# Patient Record
Sex: Female | Born: 1940 | Race: White | Hispanic: No | Smoking: Former smoker
Health system: Southern US, Community
[De-identification: ages and names within clinical notes are randomized; demographics above are authoritative.]

## PROBLEM LIST (undated history)

## (undated) DIAGNOSIS — R911 Solitary pulmonary nodule: Secondary | ICD-10-CM

## (undated) DIAGNOSIS — R252 Cramp and spasm: Secondary | ICD-10-CM

## (undated) DIAGNOSIS — E785 Hyperlipidemia, unspecified: Secondary | ICD-10-CM

## (undated) DIAGNOSIS — J40 Bronchitis, not specified as acute or chronic: Secondary | ICD-10-CM

## (undated) DIAGNOSIS — J189 Pneumonia, unspecified organism: Secondary | ICD-10-CM

## (undated) DIAGNOSIS — Z86010 Personal history of colonic polyps: Secondary | ICD-10-CM

## (undated) HISTORY — PX: TONSILLECTOMY: SHX5217

## (undated) HISTORY — PX: ABDOMINAL HYSTERECTOMY: SHX81

## (undated) HISTORY — DX: Hyperlipidemia, unspecified: E78.5

## (undated) HISTORY — PX: OTHER SURGICAL HISTORY: SHX169

## (undated) HISTORY — PX: BRONCHOSCOPY: SUR163

## (undated) HISTORY — PX: COLONOSCOPY: SHX174

---

## 2015-03-26 DIAGNOSIS — J189 Pneumonia, unspecified organism: Secondary | ICD-10-CM

## 2015-03-26 DIAGNOSIS — J40 Bronchitis, not specified as acute or chronic: Secondary | ICD-10-CM

## 2015-03-26 HISTORY — DX: Bronchitis, not specified as acute or chronic: J40

## 2015-03-26 HISTORY — DX: Pneumonia, unspecified organism: J18.9

## 2015-04-24 ENCOUNTER — Other Ambulatory Visit (HOSPITAL_COMMUNITY): Payer: Self-pay | Admitting: Pulmonary Disease

## 2015-04-24 DIAGNOSIS — R222 Localized swelling, mass and lump, trunk: Secondary | ICD-10-CM

## 2015-04-30 ENCOUNTER — Encounter: Payer: Self-pay | Admitting: Surgery

## 2015-04-30 ENCOUNTER — Institutional Professional Consult (permissible substitution) (INDEPENDENT_AMBULATORY_CARE_PROVIDER_SITE_OTHER): Payer: Medicare Other | Admitting: Surgery

## 2015-04-30 ENCOUNTER — Other Ambulatory Visit: Payer: Self-pay | Admitting: *Deleted

## 2015-04-30 VITALS — BP 131/64 | HR 74 | Resp 16 | Ht 63.0 in | Wt 125.0 lb

## 2015-04-30 DIAGNOSIS — R911 Solitary pulmonary nodule: Secondary | ICD-10-CM

## 2015-04-30 DIAGNOSIS — R918 Other nonspecific abnormal finding of lung field: Secondary | ICD-10-CM

## 2015-04-30 DIAGNOSIS — J984 Other disorders of lung: Secondary | ICD-10-CM | POA: Diagnosis not present

## 2015-05-01 ENCOUNTER — Ambulatory Visit (HOSPITAL_COMMUNITY)
Admission: RE | Admit: 2015-05-01 | Discharge: 2015-05-01 | Disposition: A | Discharge status: Home / Self Care | Payer: Medicare Other | Source: Ambulatory Visit | Attending: Pulmonary Disease | Admitting: Pulmonary Disease

## 2015-05-01 DIAGNOSIS — R222 Localized swelling, mass and lump, trunk: Secondary | ICD-10-CM | POA: Diagnosis not present

## 2015-05-01 DIAGNOSIS — Z8673 Personal history of transient ischemic attack (TIA), and cerebral infarction without residual deficits: Secondary | ICD-10-CM | POA: Diagnosis not present

## 2015-05-01 LAB — POCT I-STAT CREATININE: CREATININE: 0.7 mg/dL (ref 0.44–1.00)

## 2015-05-01 MED ORDER — GADOBENATE DIMEGLUMINE 529 MG/ML IV SOLN
15.0000 mL | Freq: Once | INTRAVENOUS | Status: AC | PRN
  Administered 2015-05-01: 11 mL via INTRAVENOUS

## 2015-05-05 ENCOUNTER — Encounter: Payer: Self-pay | Admitting: Surgery

## 2015-05-05 NOTE — Progress Notes (Signed)
Cardiothoracic Surgery Consultation  PCP is Cristine Polio, MD Referring Provider is Gwenevere Ghazi, MD  Chief Complaint  Patient presents with  . Lung Mass    right lower lobe.Marland KitchenMarland KitchenCTA CHEST/PET @ [facility] 04/13/15/04/21/15.Marland KitchenMRI BRAIN @ [facility] 05/01/15    HPI:  The patient is a 75 year old active heavy smoker who presented with some left upper chest pain radiating through to the back associated with cough productive of phlegm. She had a CXR on 04/13/2015 showing focal opacification over the right base. A CT scan the same day showed a 1.8 x 2.8 spiculated mass in the RLL just above the diaphragm. There was also an area of nodular consolidation abutting the pleura over the anterior medial lingula with adjacent reticulonodular opacification. There was a patchy area of reticulonodular opacification over the anterior left upper lobe. There was a 1 cm precarinal node and a few other small mediastinal nodes but no hilar adenopathy. A PET scan on 04/21/2015 showed the RLL mass to be hypermetabolic with an SUV of 41.66. The 3.2 x3.5 cm lingular left upper lobe part solid mass had an SUV of 5.95 which is in the malignant range. There was no hypermetabolic mediastinal or hilar adenopathy and no hypermetabolic activity anywhere else. Her left chest pain resolved quickly and has not recurred. She underwent bronchoscopy by Dr. Verdie Mosher which was unremarkable. The biopsy of the RLL showed acute and chronic inflammation with no malignancy.  She had limited spirometry performed in Dr. Marlana Salvage office which showed an FEV1 of 1.0 ( 56% predicted). She is 124 lbs with a BSA of 1.58 m2.  She had a stress echo treadmill test on 04/14/2015 which was a submaximal test with a blunted heart rate response but was negative for ischemia with a normal EF.   Past Medical History  Diagnosis Date  . Hyperlipidemia   . Hypertension     Past Surgical History  Procedure Laterality Date  . Abdominal hysterectomy      and  oophorectomy  . Tonsillectomy      History reviewed. No pertinent family history.  Social History Social History  Substance Use Topics  . Smoking status: Current Every Day Smoker -- 1.50 packs/day for 62 years    Types: Cigarettes  . Smokeless tobacco: Never Used  . Alcohol Use: No    Current Outpatient Prescriptions  Medication Sig Dispense Refill  . aspirin 325 MG tablet Take 325 mg by mouth daily.    Marland Kitchen atorvastatin (LIPITOR) 40 MG tablet Take 40 mg by mouth daily.    . benzonatate (TESSALON) 100 MG capsule Take by mouth 3 (three) times daily as needed for cough.     No current facility-administered medications for this visit.    Allergies  Allergen Reactions  . Penicillins Nausea Only and Rash    Review of Systems  Constitutional: Negative for fever, chills, activity change, appetite change, fatigue and unexpected weight change.  HENT:       Wears dentures  Eyes: Negative.   Respiratory: Positive for cough. Negative for shortness of breath.        Bronchitis  Cardiovascular: Positive for chest pain. Negative for palpitations and leg swelling.       Only one episode as per HPI  Gastrointestinal: Negative.   Endocrine: Negative.   Genitourinary: Negative.   Musculoskeletal: Positive for myalgias and back pain.  Skin: Negative.   Allergic/Immunologic: Negative.   Neurological: Negative.   Hematological: Negative.   Psychiatric/Behavioral: Negative.     BP 131/64  mmHg  Pulse 74  Resp 16  Ht '5\' 3"'$  (1.6 m)  Wt 125 lb (56.7 kg)  BMI 22.15 kg/m2  SpO2 95% Physical Exam  Constitutional: She is oriented to person, place, and time.  Elderly, thin and somewhat frail appearing woman in no distress.  HENT:  Head: Normocephalic and atraumatic.  Mouth/Throat: Oropharynx is clear and moist.  Eyes: EOM are normal. Pupils are equal, round, and reactive to light.  Neck: Normal range of motion. Neck supple. No JVD present. No thyromegaly present.  Cardiovascular: Normal  rate, regular rhythm, normal heart sounds and intact distal pulses.   No murmur heard. Pulmonary/Chest: Effort normal. No respiratory distress. She has no wheezes. She has no rales. She exhibits no tenderness.  Decreased breath sounds throughout. Barrel chest  Abdominal: Soft. Bowel sounds are normal. She exhibits no distension and no mass. There is no tenderness.  Musculoskeletal: Normal range of motion. She exhibits no edema.  Lymphadenopathy:    She has no cervical adenopathy.  Neurological: She is alert and oriented to person, place, and time. She has normal strength. No cranial nerve deficit or sensory deficit.  Skin: Skin is warm and dry.  Psychiatric: She has a normal mood and affect.     Diagnostic Tests:  CT and PET scans reviewed on PACS system.   Impression:  She has a 2.8 cm spiculated mass in the RLL that is very hypermetabolic on PET scan and certainly a lung cancer. There is no hypermetabolic hilar or mediastinal adenopathy but there is a 3.5 cm part solid/part ground glass nodular mass in the lingula that has hypermetabolic activity with an SUV of 5. This could be a synchronous lung cancer or a benign inflammatory or infectious lesion. I think it is unlikely to be a metastatic lesion. She did present with some chest pain and productive cough and could have had a pneumonia initially responsible for this lesion. There was another small and less distinct nodular lesion in the LUL too. Since she is 75 years old and has significant COPD on exam and office spirometry I think it is important to show that this lingular mass is not a cancer before considering a right lower lobectomy for treatment of the RLL mass. I will schedule a CT guided biopsy of the lingular mass and if this is negative or has decreased in size on the CT, obviating the need for a biopsy, I will consider right lower lobectomy if her complete PFT's indicate operability. I reviewed the CT and PET scan with her and her  daughter, my treatment recommendations and alternatives. They are in agreement with proceeding with the LUL biopsy and PFT's. All questions have been answered.  Plan:  1. CT guided LUL lingular mass biopsy unless it has decreased in size on CT.  2. Full PFT's with diffusion capacity.  3.  She is already scheduled for brain MRI.  4. Return to see me afterward to discuss the results and make further treatment plans.  Gaye Pollack, MD [facility] [phone]

## 2015-05-06 ENCOUNTER — Ambulatory Visit (HOSPITAL_COMMUNITY)
Admission: RE | Admit: 2015-05-06 | Discharge: 2015-05-06 | Disposition: A | Discharge status: Home / Self Care | Payer: Medicare Other | Source: Ambulatory Visit | Attending: Surgery | Admitting: Surgery

## 2015-05-06 ENCOUNTER — Other Ambulatory Visit: Payer: Self-pay | Admitting: Radiology

## 2015-05-06 DIAGNOSIS — R918 Other nonspecific abnormal finding of lung field: Secondary | ICD-10-CM | POA: Diagnosis not present

## 2015-05-06 LAB — PULMONARY FUNCTION TEST
DL/VA % pred: 75 %
DL/VA: 3.51 ml/min/mmHg/L
DLCO unc % pred: 52 %
DLCO unc: 11.97 ml/min/mmHg
FEF 25-75 Post: 0.46 L/sec
FEF 25-75 Pre: 0.71 L/sec
FEF2575-%CHANGE-POST: -35 %
FEF2575-%PRED-POST: 29 %
FEF2575-%Pred-Pre: 45 %
FEV1-%CHANGE-POST: -16 %
FEV1-%PRED-POST: 53 %
FEV1-%Pred-Pre: 64 %
FEV1-POST: 1.08 L
FEV1-PRE: 1.3 L
FEV1FVC-%CHANGE-POST: -6 %
FEV1FVC-%Pred-Pre: 88 %
FEV6-%Change-Post: -13 %
FEV6-%PRED-PRE: 75 %
FEV6-%Pred-Post: 65 %
FEV6-PRE: 1.9 L
FEV6-Post: 1.65 L
FEV6FVC-%Change-Post: 0 %
FEV6FVC-%PRED-PRE: 102 %
FEV6FVC-%Pred-Post: 102 %
FVC-%CHANGE-POST: -11 %
FVC-%PRED-POST: 65 %
FVC-%PRED-PRE: 73 %
FVC-POST: 1.74 L
FVC-PRE: 1.96 L
POST FEV6/FVC RATIO: 98 %
Post FEV1/FVC ratio: 62 %
Pre FEV1/FVC ratio: 66 %
Pre FEV6/FVC Ratio: 97 %
RV % PRED: 150 %
RV: 3.38 L
TLC % pred: 107 %
TLC: 5.28 L

## 2015-05-06 MED ORDER — ALBUTEROL SULFATE (2.5 MG/3ML) 0.083% IN NEBU
2.5000 mg | INHALATION_SOLUTION | Freq: Once | RESPIRATORY_TRACT | Status: AC
  Administered 2015-05-06: 2.5 mg via RESPIRATORY_TRACT

## 2015-05-07 ENCOUNTER — Other Ambulatory Visit: Payer: Self-pay | Admitting: Radiology

## 2015-05-09 ENCOUNTER — Ambulatory Visit (HOSPITAL_COMMUNITY)
Admission: RE | Admit: 2015-05-09 | Discharge: 2015-05-09 | Disposition: A | Discharge status: Home / Self Care | Payer: Medicare Other | Source: Ambulatory Visit | Attending: Surgery | Admitting: Surgery

## 2015-05-09 ENCOUNTER — Other Ambulatory Visit: Payer: Self-pay | Admitting: Surgery

## 2015-05-09 ENCOUNTER — Encounter (HOSPITAL_COMMUNITY): Payer: Self-pay

## 2015-05-09 DIAGNOSIS — R911 Solitary pulmonary nodule: Secondary | ICD-10-CM

## 2015-05-09 DIAGNOSIS — I2510 Atherosclerotic heart disease of native coronary artery without angina pectoris: Secondary | ICD-10-CM | POA: Diagnosis not present

## 2015-05-09 DIAGNOSIS — I700 Atherosclerosis of aorta: Secondary | ICD-10-CM | POA: Diagnosis not present

## 2015-05-09 DIAGNOSIS — R918 Other nonspecific abnormal finding of lung field: Secondary | ICD-10-CM | POA: Diagnosis not present

## 2015-05-09 LAB — CBC
HEMATOCRIT: 40.4 % (ref 36.0–46.0)
Hemoglobin: 14 g/dL (ref 12.0–15.0)
MCH: 31.2 pg (ref 26.0–34.0)
MCHC: 34.7 g/dL (ref 30.0–36.0)
MCV: 90 fL (ref 78.0–100.0)
Platelets: 194 10*3/uL (ref 150–400)
RBC: 4.49 MIL/uL (ref 3.87–5.11)
RDW: 13.4 % (ref 11.5–15.5)
WBC: 4.8 10*3/uL (ref 4.0–10.5)

## 2015-05-09 LAB — PROTIME-INR
INR: 1.02 (ref 0.00–1.49)
PROTHROMBIN TIME: 13.6 s (ref 11.6–15.2)

## 2015-05-09 LAB — APTT: APTT: 31 s (ref 24–37)

## 2015-05-09 MED ORDER — FENTANYL CITRATE (PF) 100 MCG/2ML IJ SOLN
INTRAMUSCULAR | Status: AC
  Filled 2015-05-09: qty 4

## 2015-05-09 MED ORDER — LIDOCAINE HCL 1 % IJ SOLN
INTRAMUSCULAR | Status: AC
  Filled 2015-05-09: qty 20

## 2015-05-09 MED ORDER — SODIUM CHLORIDE 0.9 % IV SOLN
INTRAVENOUS | Status: DC
  Administered 2015-05-09: 10:00:00 via INTRAVENOUS

## 2015-05-09 MED ORDER — MIDAZOLAM HCL 2 MG/2ML IJ SOLN
INTRAMUSCULAR | Status: AC
  Filled 2015-05-09: qty 6

## 2015-05-09 NOTE — Sedation Documentation (Signed)
MD spoke with pt today after viewing CT. Pt will not need lung bx today.

## 2015-05-09 NOTE — H&P (Signed)
Chief Complaint: Patient was seen in consultation today for left lung mass biopsy at the request of Gaye Pollack  Referring Physician(s): Gaye Pollack  Supervising Physician: Arne Cleveland  History of Present Illness: Pamela Mueller is a 75 y.o. female   ++ smoker COPD Was seen 03/2015 for chest pian Work up revealed B lung masses and possible pneumonia Treated for PNA and has done well CT and PET scans + for RLL mass Bronchoscopy performed in [facility] by Dr Verdie Mosher Negative/unremarkable per Dr Cyndia Bent note  She has a 2.8 cm spiculated mass in the RLL that is very hypermetabolic on PET scan and certainly a lung cancer. There is no hypermetabolic hilar or mediastinal adenopathy but there is a 3.5 cm part solid/part ground glass nodular mass in the lingula that has hypermetabolic activity with an SUV of 5. This could be a synchronous lung cancer or a benign inflammatory or infectious lesion. I think it is unlikely to be a metastatic lesion. She did present with some chest pain and productive cough and could have had a pneumonia initially responsible for this lesion. There was another small and less distinct nodular lesion in the LUL too. Since she is 75 years old and has significant COPD on exam and office spirometry I think it is important to show that this lingular mass is not a cancer before considering a right lower lobectomy for treatment of the RLL mass. I will schedule a CT guided biopsy of the lingular mass and if this is negative or has decreased in size on the CT, obviating the need for a biopsy, I will consider right lower lobectomy if her complete PFT's indicate operability. I reviewed the CT and PET scan with her and her daughter, my treatment recommendations and alternatives. They are in agreement with proceeding with the LUL biopsy and PFT's. All questions have been answered.  Request now of Dr Cyndia Bent for Left lung mass biopsy to determine fully plan for this pt Imaging has  been reviewed with dr Vernard Gambles and approves procedure  Past Medical History  Diagnosis Date  . Hyperlipidemia   . Hypertension     Past Surgical History  Procedure Laterality Date  . Abdominal hysterectomy      and oophorectomy  . Tonsillectomy      Allergies: Penicillins  Medications: Prior to Admission medications   Medication Sig Start Date End Date Taking? Authorizing Provider  aspirin 325 MG tablet Take 325 mg by mouth daily.   Yes Historical Provider, MD  atorvastatin (LIPITOR) 40 MG tablet Take 40 mg by mouth daily.   Yes Historical Provider, MD  benzonatate (TESSALON) 100 MG capsule Take by mouth 3 (three) times daily as needed for cough.    Historical Provider, MD     History reviewed. No pertinent family history.  Social History   Social History  . Marital Status: Widowed    Spouse Name: N/A  . Number of Children: N/A  . Years of Education: N/A   Social History Main Topics  . Smoking status: Current Every Day Smoker -- 1.50 packs/day for 62 years    Types: Cigarettes  . Smokeless tobacco: Never Used  . Alcohol Use: No  . Drug Use: None  . Sexual Activity: Not Asked   Other Topics Concern  . None   Social History Narrative    Review of Systems: A 12 point ROS discussed and pertinent positives are indicated in the HPI above.  All other systems are negative.  Review of  Systems  Constitutional: Negative for fever, activity change, appetite change and fatigue.  Respiratory: Negative for shortness of breath.   Cardiovascular: Negative for chest pain.  Neurological: Negative for weakness.  Psychiatric/Behavioral: Negative for behavioral problems and confusion.    Vital Signs: BP 137/76 mmHg  Pulse 73  Temp(Src) 97.6 F (36.4 C) (Oral)  Resp 16  Ht 5' 3.5" (1.613 m)  Wt 125 lb (56.7 kg)  BMI 21.79 kg/m2  SpO2 98%  Physical Exam  Constitutional: She is oriented to person, place, and time.  Cardiovascular: Normal rate, regular rhythm and normal  heart sounds.   Pulmonary/Chest: Effort normal and breath sounds normal. She has no wheezes.  Abdominal: Soft. Bowel sounds are normal. There is no tenderness.  Musculoskeletal: Normal range of motion.  Neurological: She is alert and oriented to person, place, and time.  Skin: Skin is warm and dry.  Psychiatric: She has a normal mood and affect. Her behavior is normal. Judgment and thought content normal.  Nursing note and vitals reviewed.   Mallampati Score:  MD Evaluation Airway: WNL Heart: WNL Abdomen: WNL Chest/ Lungs: WNL ASA  Classification: 3 Mallampati/Airway Score: One  Imaging: Mr Kizzie Fantasia Contrast  05/02/2015  CLINICAL DATA:  New diagnosis lung cancer.  Staging. EXAM: MRI HEAD WITHOUT AND WITH CONTRAST TECHNIQUE: Multiplanar, multiecho pulse sequences of the brain and surrounding structures were obtained without and with intravenous contrast. CONTRAST:  11 cc MultiHance COMPARISON:  None. FINDINGS: Diffusion imaging does not show any acute or subacute infarction. 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 There are minimal chronic small-vessel ischemic changes of the hemispheric white matter. No evidence of hemorrhage, hydrocephalus or extra-axial collection. After contrast administration, there is no abnormal enhancement. No evidence of metastatic disease. No skull or skullbase lesion is seen. T1 hyperintense focus at the right parietal vertex is benign. IMPRESSION: No evidence of metastatic disease. 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 Electronically Signed   By: Nelson Chimes M.D.   On: 05/02/2015 08:29    Labs:  CBC:  Recent Labs  05/09/15 0955  WBC 4.8  HGB 14.0  HCT 40.4  PLT 194    COAGS:  Recent Labs  05/09/15 0955  INR 1.02  APTT 31    BMP:  Recent Labs  05/01/15 1912  CREATININE 0.70    LIVER FUNCTION TESTS: No results for input(s): BILITOT, AST, ALT, ALKPHOS, PROT, ALBUMIN in the last 8760 hours.  TUMOR MARKERS: No results for input(s):  AFPTM, CEA, CA199, CHROMGRNA in the last 8760 hours.  Assessment and Plan:  Lung masses in COPD/+smoker pt +PET Bronch unremarkable Has been evaluated with Dr Cyndia Bent For left lung mass biopsy now Risks and Benefits discussed with the patient including, but not limited to bleeding, hemoptysis, respiratory failure requiring intubation, infection, pneumothorax requiring chest tube placement, stroke from air embolism or even death. All of the patient's questions were answered, patient is agreeable to proceed. Consent signed and in chart.   Thank you for this interesting consult.  I greatly enjoyed meeting Pamela Mueller and look forward to participating in their care.  A copy of this report was sent to the requesting provider on this date.  Electronically Signed: Monia Sabal A 05/09/2015, 10:19 AM   I spent a total of  30 Minutes   in face to face in clinical consultation, greater than 50% of which was counseling/coordinating care for left lung mass biopsy

## 2015-05-09 NOTE — Sedation Documentation (Signed)
Pt D/C home with dtr

## 2015-05-14 ENCOUNTER — Ambulatory Visit (INDEPENDENT_AMBULATORY_CARE_PROVIDER_SITE_OTHER): Payer: Medicare Other | Admitting: Surgery

## 2015-05-14 ENCOUNTER — Encounter: Payer: Self-pay | Admitting: Surgery

## 2015-05-14 VITALS — BP 136/77 | HR 76 | Resp 20 | Ht 63.5 in | Wt 125.0 lb

## 2015-05-14 DIAGNOSIS — J984 Other disorders of lung: Secondary | ICD-10-CM

## 2015-05-14 DIAGNOSIS — R918 Other nonspecific abnormal finding of lung field: Secondary | ICD-10-CM

## 2015-05-19 ENCOUNTER — Encounter: Payer: Self-pay | Admitting: Surgery

## 2015-05-19 NOTE — Progress Notes (Signed)
HPI:  The patient returns for review of her repeat CT scan done to evaluate the left upper lobe partially solid mass that was hypermetabolic on PET scan. This showed that this lesion was smaller consistent with a resolving infectious or inflammatory process. Biopsy was therefore deferred. The 2.7 cm spiculated mass in the RLL was unchanged and this is intensely hypermetabolic on PET scan. Her PFT's show a moderate obstructive defect with an FEV1 of 1.3 ( 64% predicted) and a moderately severe diffusion defect ( 52% predicted) There was no response to bronchodilators. She continues to smoke 1-1/2 packs per day.  Current Outpatient Prescriptions  Medication Sig Dispense Refill  . aspirin 325 MG tablet Take 325 mg by mouth daily.    Marland Kitchen atorvastatin (LIPITOR) 40 MG tablet Take 40 mg by mouth daily.    . benzonatate (TESSALON) 100 MG capsule Take by mouth 3 (three) times daily as needed for cough.     No current facility-administered medications for this visit.     Physical Exam: BP 136/77 mmHg  Pulse 76  Resp 20  Ht 5' 3.5" (1.613 m)  Wt 125 lb (56.7 kg)  BMI 21.79 kg/m2  SpO2 96% She looks comfortable and can carry on a conversation without getting short of breath. Lungs reveal decreased BS throughout that are clear.  Diagnostic Tests:  CLINICAL DATA: Hypermetabolic right lower lobe mass. Hypermetabolic lingular lesion. Recent chest pain. Axial scans through the thorax were obtained in contemplation of percutaneous biopsy.  EXAM: CT CHEST WITHOUT CONTRAST  TECHNIQUE: Multidetector CT imaging of the chest was performed following the standard protocol without IV contrast.  COMPARISON: PET-CT 04/21/2015, CT 04/13/2015  FINDINGS: Mediastinum/Lymph Nodes: Moderate coronary and aortic calcifications. Heart size normal. No pericardial effusion. 12 mm precarinal lymph node, stable by my measurement. No other hilar or mediastinal adenopathy is evident.  Lungs/Pleura:  Spiculated 27 mm nodule centrally at the right lung base, stable.  12 x 7 mm pleural-based scarring or subsegmental atelectasis in the lingula anteromedially, previously 30 x 17 mm. There has been resolution of the regional patchy alveolar opacities previously seen around the lesion. 8 mm nodule in the anterior right upper lobe is stable (image 37/2). No new pulmonary nodule or consolidation. No pleural effusion or pneumothorax.  Upper abdomen: Hepatic segment 8 cyst stable. No acute findings.  Musculoskeletal: Spondylitic changes in the visualized lower cervical spine. Endplate spurring in the lower thoracic spine. No worrisome lytic or sclerotic lesion.  IMPRESSION: 1. Significant decrease in size of anterior pleural-based lingular consolidation since prior studies, consistent with resolving infectious/inflammatory process. Percutaneous biopsy was therefore deferred. 2. Persistent 2.7 cm spiculated right lower lobe mass suggestive of primary bronchogenic carcinoma. 3. No new nodule, adenopathy, or other suggestion of disease progression. 4. Atherosclerosis, including aortic and coronary artery disease. Please note that although the presence of coronary artery calcium documents the presence of coronary artery disease, the severity of this disease and any potential stenosis cannot be assessed on this non-gated CT examination. Assessment for potential risk factor modification, dietary therapy or pharmacologic therapy may be warranted, if clinically indicated.   Electronically Signed  By: Lucrezia Europe M.D.  On: 05/09/2015 14:16    Ref Range 13d ago    FVC-Pre L 1.96   FVC-%Pred-Pre % 73   FVC-Post L 1.74   FVC-%Pred-Post % 65   FVC-%Change-Post % -11   FEV1-Pre L 1.30   FEV1-%Pred-Pre % 64   FEV1-Post L 1.08   FEV1-%Pred-Post % 53  FEV1-%Change-Post % -16   FEV6-Pre L 1.90   FEV6-%Pred-Pre % 75   FEV6-Post L 1.65   FEV6-%Pred-Post % 65     FEV6-%Change-Post % -13   Pre FEV1/FVC ratio % 66   FEV1FVC-%Pred-Pre % 88   Post FEV1/FVC ratio % 62   FEV1FVC-%Change-Post % -6   Pre FEV6/FVC Ratio % 97   FEV6FVC-%Pred-Pre % 102   Post FEV6/FVC ratio % 98   FEV6FVC-%Pred-Post % 102   FEV6FVC-%Change-Post % 0   FEF 25-75 Pre L/sec 0.71   FEF2575-%Pred-Pre % 45   FEF 25-75 Post L/sec 0.46   FEF2575-%Pred-Post % 29   FEF2575-%Change-Post % -35   RV L 3.38   RV % pred % 150   TLC L 5.28   TLC % pred % 107   DLCO unc ml/min/mmHg 11.97   DLCO unc % pred % 52   DL/VA ml/min/mmHg/L 3.51   DL/VA % pred % 75   Resulting Agency [facility]       Specimen Collected: 05/06/15 2:58 PM Last Resulted: 05/06/15 4:04 PM                 Scans on Order [id number]        Scan on 05/06/2015 4:56 PM by Deneise Lever, MD         Impression:  She has a 2.7 cm spiculated hypermetabolic lesion in the right lower lobe that is most likely a bronchogenic carcinoma. The left upper lobe lesion is smaller and most likely infectious or inflammatory in this smoker. Her PFT's are probably adequate to allow a right lower lobectomy but at her age I would only do it if she quit smoking for 3 weeks preop to clear up her lungs. She feels like she could do that and would like to pursue surgical treatment. Her daughters are with her and will be with her to observe and encourage. She still works every day at [employer] and walks all day long so I think she would probably do well with surgery if she quits smoking.  Plan:  She will stop smoking and her daughters will call me in 2 weeks to let me know how she is doing. If she does quit smoking then I will plan to do surgery in about 3 weeks.   Gaye Pollack, MD [facility] [phone]

## 2015-05-28 ENCOUNTER — Other Ambulatory Visit: Payer: Self-pay | Admitting: *Deleted

## 2015-05-28 DIAGNOSIS — R911 Solitary pulmonary nodule: Secondary | ICD-10-CM

## 2015-06-25 ENCOUNTER — Encounter: Payer: Self-pay | Admitting: Surgery

## 2015-06-25 ENCOUNTER — Ambulatory Visit (INDEPENDENT_AMBULATORY_CARE_PROVIDER_SITE_OTHER): Payer: Medicare Other | Admitting: Surgery

## 2015-06-25 VITALS — BP 107/65 | HR 67 | Resp 16 | Ht 63.5 in | Wt 126.4 lb

## 2015-06-25 DIAGNOSIS — D381 Neoplasm of uncertain behavior of trachea, bronchus and lung: Secondary | ICD-10-CM | POA: Diagnosis not present

## 2015-06-25 NOTE — Progress Notes (Signed)
     HPI:  The patient returns for follow up before planned right thoracotomy and right lower lobectomy for presumed lung cancer on Tuesday next week. She quit smoking after our last visit on 05/05/2015 and feels much better. She continues to work at [employer] and is very active.  Current Outpatient Prescriptions  Medication Sig Dispense Refill  . aspirin 325 MG tablet Take 325 mg by mouth daily.     No current facility-administered medications for this visit.     Physical Exam: BP 107/65 mmHg  Pulse 67  Resp 16  Ht 5' 3.5" (1.613 m)  Wt 126 lb 6.4 oz (57.335 kg)  BMI 22.04 kg/m2  SpO2 97% She looks well Lungs are clear Cardiac exam shows a regular rate and rhythm with normal heart sounds   Diagnostic Tests:  None today  Impression:  She is ready for right lower lobectomy and has quit smoking for the past two months. I discussed the surgery again with her and her two daughters including alternatives, benefits and risks including but not limited to bleeding, infection, prolonged air leak, respiratory failure, and recurrent cancer. She understands that if there is cancer in any of her lymph nodes that she will need adjuvant chemotherapy. She agrees to proceed.  Plan:  Flexible bronchoscopy, right thoracotomy and right lower lobectomy on Tuesday 07/01/2015.   Gaye Pollack, MD [facility] [phone]

## 2015-06-26 ENCOUNTER — Encounter (HOSPITAL_COMMUNITY): Payer: Self-pay

## 2015-06-26 ENCOUNTER — Other Ambulatory Visit: Payer: Self-pay

## 2015-06-26 ENCOUNTER — Other Ambulatory Visit (HOSPITAL_COMMUNITY): Payer: Medicare Other

## 2015-06-26 ENCOUNTER — Encounter (HOSPITAL_COMMUNITY)
Admission: RE | Admit: 2015-06-26 | Discharge: 2015-06-26 | Disposition: A | Discharge status: Home / Self Care | Payer: Medicare Other | Source: Ambulatory Visit | Attending: Surgery | Admitting: Surgery

## 2015-06-26 VITALS — BP 118/62 | HR 63 | Temp 97.4°F | Resp 18 | Ht 63.0 in | Wt 126.0 lb

## 2015-06-26 DIAGNOSIS — Z01812 Encounter for preprocedural laboratory examination: Secondary | ICD-10-CM | POA: Diagnosis not present

## 2015-06-26 DIAGNOSIS — Z87891 Personal history of nicotine dependence: Secondary | ICD-10-CM | POA: Insufficient documentation

## 2015-06-26 DIAGNOSIS — Z79899 Other long term (current) drug therapy: Secondary | ICD-10-CM | POA: Diagnosis not present

## 2015-06-26 DIAGNOSIS — R911 Solitary pulmonary nodule: Secondary | ICD-10-CM | POA: Diagnosis not present

## 2015-06-26 DIAGNOSIS — Z7982 Long term (current) use of aspirin: Secondary | ICD-10-CM | POA: Diagnosis not present

## 2015-06-26 DIAGNOSIS — Z01818 Encounter for other preprocedural examination: Secondary | ICD-10-CM | POA: Diagnosis present

## 2015-06-26 DIAGNOSIS — E785 Hyperlipidemia, unspecified: Secondary | ICD-10-CM | POA: Insufficient documentation

## 2015-06-26 DIAGNOSIS — Z0183 Encounter for blood typing: Secondary | ICD-10-CM | POA: Insufficient documentation

## 2015-06-26 HISTORY — DX: Bronchitis, not specified as acute or chronic: J40

## 2015-06-26 HISTORY — DX: Personal history of colonic polyps: Z86.010

## 2015-06-26 HISTORY — DX: Solitary pulmonary nodule: R91.1

## 2015-06-26 HISTORY — DX: Cramp and spasm: R25.2

## 2015-06-26 HISTORY — DX: Pneumonia, unspecified organism: J18.9

## 2015-06-26 LAB — URINALYSIS, ROUTINE W REFLEX MICROSCOPIC
BILIRUBIN URINE: NEGATIVE
GLUCOSE, UA: NEGATIVE mg/dL
HGB URINE DIPSTICK: NEGATIVE
Ketones, ur: NEGATIVE mg/dL
Leukocytes, UA: NEGATIVE
Nitrite: NEGATIVE
PROTEIN: NEGATIVE mg/dL
SPECIFIC GRAVITY, URINE: 1.01 (ref 1.005–1.030)
pH: 6 (ref 5.0–8.0)

## 2015-06-26 LAB — COMPREHENSIVE METABOLIC PANEL
ALBUMIN: 3.8 g/dL (ref 3.5–5.0)
ALK PHOS: 50 U/L (ref 38–126)
ALT: 16 U/L (ref 14–54)
ANION GAP: 11 (ref 5–15)
AST: 17 U/L (ref 15–41)
BUN: 10 mg/dL (ref 6–20)
CALCIUM: 9.3 mg/dL (ref 8.9–10.3)
CO2: 22 mmol/L (ref 22–32)
Chloride: 107 mmol/L (ref 101–111)
Creatinine, Ser: 0.55 mg/dL (ref 0.44–1.00)
GFR calc non Af Amer: >60 mL/min (ref >60)
Glucose, Bld: 100 mg/dL — ABNORMAL HIGH (ref 65–99)
POTASSIUM: 4 mmol/L (ref 3.5–5.1)
SODIUM: 140 mmol/L (ref 135–145)
Total Bilirubin: 0.4 mg/dL (ref 0.3–1.2)
Total Protein: 6.2 g/dL — ABNORMAL LOW (ref 6.5–8.1)

## 2015-06-26 LAB — CBC
HCT: 37.1 % (ref 36.0–46.0)
Hemoglobin: 12.6 g/dL (ref 12.0–15.0)
MCH: 30.2 pg (ref 26.0–34.0)
MCHC: 34 g/dL (ref 30.0–36.0)
MCV: 89 fL (ref 78.0–100.0)
PLATELETS: 161 10*3/uL (ref 150–400)
RBC: 4.17 MIL/uL (ref 3.87–5.11)
RDW: 13.1 % (ref 11.5–15.5)
WBC: 5 10*3/uL (ref 4.0–10.5)

## 2015-06-26 LAB — PROTIME-INR
INR: 0.99 (ref 0.00–1.49)
Prothrombin Time: 13.3 seconds (ref 11.6–15.2)

## 2015-06-26 LAB — APTT: APTT: 28 s (ref 24–37)

## 2015-06-26 LAB — BLOOD GAS, ARTERIAL
ACID-BASE EXCESS: 1.3 mmol/L (ref 0.0–2.0)
BICARBONATE: 25.4 meq/L — AB (ref 20.0–24.0)
Drawn by: 421801
FIO2: 0.21
O2 SAT: 97 %
PATIENT TEMPERATURE: 98.6
PO2 ART: 90.5 mmHg (ref 80.0–100.0)
TCO2: 26.6 mmol/L (ref 0–100)
pCO2 arterial: 40.4 mmHg (ref 35.0–45.0)
pH, Arterial: 7.414 (ref 7.350–7.450)

## 2015-06-26 LAB — TYPE AND SCREEN
ABO/RH(D): A POS
ANTIBODY SCREEN: NEGATIVE

## 2015-06-26 LAB — ABO/RH: ABO/RH(D): A POS

## 2015-06-26 LAB — SURGICAL PCR SCREEN
MRSA, PCR: NEGATIVE
Staphylococcus aureus: NEGATIVE

## 2015-06-26 NOTE — Pre-Procedure Instructions (Signed)
Ronae Noell  06/26/2015     No Pharmacies Listed   Your procedure is scheduled on Tues, May 9 @ 7:30 AM  Report to [facility] Admitting at 5:30 AM  Call this number if you have problems the morning of surgery:  [phone]   Remember:  Do not eat food or drink liquids after midnight.              Stop taking your Aspirin. No Goody's,BC's,Aleve,Advil,Motrin,Ibuprofen,Fish Oil,or any Herbal Medications.    Do not wear jewelry, make-up or nail polish.  Do not wear lotions, powders, or perfumes.   Do not shave 48 hours prior to surgery.    Do not bring valuables to the hospital.  [facility] is not responsible for any belongings or valuables.  Contacts, dentures or bridgework may not be worn into surgery.  Leave your suitcase in the car.  After surgery it may be brought to your room.  For patients admitted to the hospital, discharge time will be determined by your treatment team.  Patients discharged the day of surgery will not be allowed to drive home.    Special instructions:  [facility] - Preparing for Surgery  Before surgery, you can play an important role.  Because skin is not sterile, your skin needs to be as free of germs as possible.  You can reduce the number of germs on you skin by washing with CHG (chlorahexidine gluconate) soap before surgery.  CHG is an antiseptic cleaner which kills germs and bonds with the skin to continue killing germs even after washing.  Please DO NOT use if you have an allergy to CHG or antibacterial soaps.  If your skin becomes reddened/irritated stop using the CHG and inform your nurse when you arrive at Short Stay.  Do not shave (including legs and underarms) for at least 48 hours prior to the first CHG shower.  You may shave your face.  Please follow these instructions carefully:   1.  Shower with CHG Soap the night before surgery and the                                morning of Surgery.  2.  If you choose to wash your  hair, wash your hair first as usual with your       normal shampoo.  3.  After you shampoo, rinse your hair and body thoroughly to remove the                      Shampoo.  4.  Use CHG as you would any other liquid soap.  You can apply chg directly       to the skin and wash gently with scrungie or a clean washcloth.  5.  Apply the CHG Soap to your body ONLY FROM THE NECK DOWN.        Do not use on open wounds or open sores.  Avoid contact with your eyes,       ears, mouth and genitals (private parts).  Wash genitals (private parts)       with your normal soap.  6.  Wash thoroughly, paying special attention to the area where your surgery        will be performed.  7.  Thoroughly rinse your body with warm water from the neck down.  8.  DO NOT shower/wash with your normal soap  after using and rinsing off       the CHG Soap.  9.  Pat yourself dry with a clean towel.            10.  Wear clean pajamas.            11.  Place clean sheets on your bed the night of your first shower and do not        sleep with pets.  Day of Surgery  Do not apply any lotions/deoderants the morning of surgery.  Please wear clean clothes to the hospital/surgery center.    Please read over the following fact sheets that you were given. Pain Booklet, Coughing and Deep Breathing, Blood Transfusion Information, MRSA Information and Surgical Site Infection Prevention

## 2015-06-26 NOTE — Progress Notes (Addendum)
Cardiologist denies  Medical Md is Dr. Fredrich Romans  Echo report in epic from 05-08-15  Stress test report in epic from 04-15-15  Heart cath denies  EKG denies in past month

## 2015-06-27 NOTE — Progress Notes (Signed)
Anesthesia Chart Review:  Pt is a 75 year old female scheduled for video bronchoscopy, thoracotomy, R lower lobectomy on 07/01/2015 with Dr. Cyndia Bent.   PMH includes:  Hyperlipidemia, R lung lesion. Former smoker. BMI 22  Medications include: ASA, lipitor  Preoperative labs reviewed.    CT chest 05/09/15:  1. Significant decrease in size of anterior pleural-based lingular consolidation since prior studies, consistent with resolving infectious/inflammatory process. Percutaneous biopsy was therefore deferred. 2. Persistent 2.7 cm spiculated right lower lobe mass suggestive of primary bronchogenic carcinoma. 3. No new nodule, adenopathy, or other suggestion of disease progression. 4. Atherosclerosis, including aortic and coronary artery disease. Please note that although the presence of coronary artery calcium documents the presence of coronary artery disease, the severity of this disease and any potential stenosis cannot be assessed on this non-gated CT examination. Assessment for potential risk factor modification, dietary therapy or pharmacologic therapy may be warranted, if clinically indicated.  EKG 06/26/15: NSR. Septal infarct, age undetermined. Comparison EKG 04/13/15 at [facility] shows septal infarct by notes; tracing requested.   Stress echo 04/15/15:  - Functional capacity is normal for age/sex - 10 METS on the 2 min Bruce protocol - Submaximal exercise treadmill test with blunted heart rate response likely due to meds - Negative stress echocardiogram for ischemia at submaximal stress, failure to achieve target HR reduces sensitivity of test - EF >50%  If no changes, I anticipate pt can proceed with surgery as scheduled.   Willeen Cass, FNP-BC [facility] Stay Surgical Center/Anesthesiology Phone: [phone] 06/27/2015 1:04 PM

## 2015-06-30 NOTE — H&P (Signed)
[address]       [place], [address]             [id number]      Cardiothoracic Surgery History and Physical   PCP is Cristine Polio, MD Referring Provider is Gwenevere Ghazi, MD  Chief Complaint  Patient presents with  . Lung Mass    right lower lobe.Marland KitchenMarland KitchenCTA CHEST/PET @ [facility] 04/13/15/04/21/15.Marland KitchenMRI BRAIN @ [facility] 05/01/15    HPI:  The patient is a 75 year old active heavy smoker who presented with some left upper chest pain radiating through to the back associated with cough productive of phlegm. She had a CXR on 04/13/2015 showing focal opacification over the right base. A CT scan the same day showed a 1.8 x 2.8 spiculated mass in the RLL just above the diaphragm. There was also an area of nodular consolidation abutting the pleura over the anterior medial lingula with adjacent reticulonodular opacification. There was a patchy area of reticulonodular opacification over the anterior left upper lobe. There was a 1 cm precarinal node and a few other small mediastinal nodes but no hilar adenopathy. A PET scan on 04/21/2015 showed the RLL mass to be hypermetabolic with an SUV of 37.09. The 3.2 x3.5 cm lingular left upper lobe part solid mass had an SUV of 5.95 which is in the malignant range. There was no hypermetabolic mediastinal or hilar adenopathy and no hypermetabolic activity anywhere else. Her left chest pain resolved quickly and has not recurred. She underwent bronchoscopy by Dr. Verdie Mosher which was unremarkable. The biopsy of the RLL showed acute and chronic inflammation with no malignancy.  She had limited spirometry performed in Dr. Marlana Salvage office which showed an FEV1 of 1.0 ( 56% predicted). She is 124 lbs with a BSA of 1.58 m2.  She had a stress echo treadmill test on 04/14/2015 which was a submaximal test with a blunted heart rate response but was negative for ischemia with a normal EF.  When I saw her initially on 05/05/2015 I ordered a repeat CT scan  to evaluate the left upper lobe partially solid mass that was hypermetabolic on PET scan. The lesion was smaller consistent with a resolving infectious or inflammatory process. Biopsy was therefore deferred. The 2.7 cm spiculated mass in the RLL was unchanged and this is intensely hypermetabolic on PET scan. Her PFT's show a moderate obstructive defect with an FEV1 of 1.3 ( 64% predicted) and a moderately severe diffusion defect ( 52% predicted) There was no response to bronchodilators. She continued to smoke 1-1/2 packs per day. I felt that her PFT's were probably adequate to tolerate a lower lobectomy if she quit smoking. She quit smoking after our visit on 05/05/2015 and has felt much better. She has continued to work at [employer] and has remained active.   Past Medical History  Diagnosis Date  . Hyperlipidemia   . Hypertension     Past Surgical History  Procedure Laterality Date  . Abdominal hysterectomy      and oophorectomy  . Tonsillectomy      History reviewed. No pertinent family history.  Social History Social History  Substance Use Topics  . Smoking status: Current Every Day Smoker -- 1.50 packs/day for 62 years    Types: Cigarettes  . Smokeless tobacco: Never Used  . Alcohol Use: No    Current Outpatient Prescriptions  Medication Sig Dispense Refill  . aspirin 325 MG tablet Take 325 mg by mouth daily.    Marland Kitchen  atorvastatin (LIPITOR) 40 MG tablet Take 40 mg by mouth daily.    . benzonatate (TESSALON) 100 MG capsule Take by mouth 3 (three) times daily as needed for cough.     No current facility-administered medications for this visit.    Allergies  Allergen Reactions  . Penicillins Nausea Only and Rash    Review of Systems  Constitutional: Negative for fever, chills, activity change, appetite change, fatigue and unexpected weight change.  HENT:   Wears dentures  Eyes: Negative.    Respiratory: Positive for cough. Negative for shortness of breath.   Bronchitis  Cardiovascular: Positive for chest pain. Negative for palpitations and leg swelling.   Only one episode as per HPI  Gastrointestinal: Negative.  Endocrine: Negative.  Genitourinary: Negative.  Musculoskeletal: Positive for myalgias and back pain.  Skin: Negative.  Allergic/Immunologic: Negative.  Neurological: Negative.  Hematological: Negative.  Psychiatric/Behavioral: Negative.    BP 131/64 mmHg  Pulse 74  Resp 16  Ht '5\' 3"'$  (1.6 m)  Wt 125 lb (56.7 kg)  BMI 22.15 kg/m2  SpO2 95% Physical Exam  Constitutional: She is oriented to person, place, and time.  Elderly, thin and somewhat frail appearing woman in no distress.  HENT:  Head: Normocephalic and atraumatic.  Mouth/Throat: Oropharynx is clear and moist.  Eyes: EOM are normal. Pupils are equal, round, and reactive to light.  Neck: Normal range of motion. Neck supple. No JVD present. No thyromegaly present.  Cardiovascular: Normal rate, regular rhythm, normal heart sounds and intact distal pulses.  No murmur heard. Pulmonary/Chest: Effort normal. No respiratory distress. She has no wheezes. She has no rales. She exhibits no tenderness.  Decreased breath sounds throughout. Barrel chest  Abdominal: Soft. Bowel sounds are normal. She exhibits no distension and no mass. There is no tenderness.  Musculoskeletal: Normal range of motion. She exhibits no edema.  Lymphadenopathy:   She has no cervical adenopathy.  Neurological: She is alert and oriented to person, place, and time. She has normal strength. No cranial nerve deficit or sensory deficit.  Skin: Skin is warm and dry.  Psychiatric: She has a normal mood and affect.     Diagnostic Tests:  CT and PET scans reviewed on PACS system.   Impression:  She has a 2.8 cm spiculated mass in the RLL that is very hypermetabolic on PET scan and certainly a lung cancer.  There is no hypermetabolic hilar or mediastinal adenopathy. The left lung nodule is smaller and is most likely a resolving infectious or inflammatory process. I think her PFT's are adequate to tolerate a right lower lobectomy and she has quit smoking for almost two months. I discussed the surgery with her and her daughters including the alternative of biopsy and XRT. I discussed benefits and risks including but not limited to bleeding, blood transfusion, infection, respiratory complications, prolonged air leak and pleural space complications as well as the risk of recurrent cancer. She understands and agrees to proceed.  Plan:  Flexible bronchoscopy and right thoracotomy for right lower lobectomy on Tuesday 07/01/2015.   Gaye Pollack, MD [facility] [phone]

## 2015-06-30 NOTE — Anesthesia Preprocedure Evaluation (Addendum)
Anesthesia Evaluation  Patient identified by MRN, date of birth, ID band Patient awake    Reviewed: Allergy & Precautions, NPO status , Patient's Chart, lab work & pertinent test results  Airway Mallampati: I  TM Distance: >3 FB Neck ROM: Full    Dental  (+) Edentulous Upper, Edentulous Lower   Pulmonary former smoker,    breath sounds clear to auscultation       Cardiovascular negative cardio ROS   Rhythm:Regular Rate:Normal     Neuro/Psych negative neurological ROS  negative psych ROS   GI/Hepatic negative GI ROS, Neg liver ROS,   Endo/Other  negative endocrine ROS  Renal/GU negative Renal ROS  negative genitourinary   Musculoskeletal negative musculoskeletal ROS (+)   Abdominal Normal abdominal exam  (+)   Peds negative pediatric ROS (+)  Hematology negative hematology ROS (+)   Anesthesia Other Findings   Reproductive/Obstetrics negative OB ROS                            Lab Results  Component Value Date   WBC 5.0 06/26/2015   HGB 12.6 06/26/2015   HCT 37.1 06/26/2015   MCV 89.0 06/26/2015   PLT 161 06/26/2015   Lab Results  Component Value Date   CREATININE 0.55 06/26/2015   BUN 10 06/26/2015   NA 140 06/26/2015   K 4.0 06/26/2015   CL 107 06/26/2015   CO2 22 06/26/2015   Lab Results  Component Value Date   INR 0.99 06/26/2015   INR 1.02 05/09/2015   06/2015 EKG: NSR  Anesthesia Physical Anesthesia Plan  ASA: II  Anesthesia Plan: General   Post-op Pain Management:    Induction: Intravenous  Airway Management Planned: Double Lumen EBT  Additional Equipment: Arterial line, CVP and Ultrasound Guidance Line Placement  Intra-op Plan:   Post-operative Plan: Possible Post-op intubation/ventilation  Informed Consent: I have reviewed the patients History and Physical, chart, labs and discussed the procedure including the risks, benefits and alternatives for the  proposed anesthesia with the patient or authorized representative who has indicated his/her understanding and acceptance.   Dental advisory given  Plan Discussed with: CRNA  Anesthesia Plan Comments:         Anesthesia Quick Evaluation

## 2015-07-01 ENCOUNTER — Inpatient Hospital Stay (HOSPITAL_COMMUNITY): Payer: Medicare Other

## 2015-07-01 ENCOUNTER — Encounter (HOSPITAL_COMMUNITY): Payer: Self-pay | Admitting: *Deleted

## 2015-07-01 ENCOUNTER — Inpatient Hospital Stay (HOSPITAL_COMMUNITY)
Admission: RE | Admit: 2015-07-01 | Discharge: 2015-07-08 | DRG: 164 | Disposition: A | Discharge status: Home / Self Care | Payer: Medicare Other | Source: Ambulatory Visit | Attending: Surgery | Admitting: Surgery

## 2015-07-01 ENCOUNTER — Inpatient Hospital Stay (HOSPITAL_COMMUNITY): Payer: Medicare Other | Admitting: Emergency Medicine

## 2015-07-01 ENCOUNTER — Encounter (HOSPITAL_COMMUNITY)
Admission: RE | Disposition: A | Discharge status: Home / Self Care | Payer: Self-pay | Source: Ambulatory Visit | Attending: Surgery

## 2015-07-01 ENCOUNTER — Inpatient Hospital Stay (HOSPITAL_COMMUNITY): Payer: Medicare Other | Admitting: Certified Registered Nurse Anesthetist

## 2015-07-01 DIAGNOSIS — E785 Hyperlipidemia, unspecified: Secondary | ICD-10-CM | POA: Diagnosis not present

## 2015-07-01 DIAGNOSIS — G4700 Insomnia, unspecified: Secondary | ICD-10-CM | POA: Diagnosis not present

## 2015-07-01 DIAGNOSIS — Z880 Allergy status to penicillin: Secondary | ICD-10-CM | POA: Diagnosis not present

## 2015-07-01 DIAGNOSIS — C3431 Malignant neoplasm of lower lobe, right bronchus or lung: Principal | ICD-10-CM | POA: Diagnosis present

## 2015-07-01 DIAGNOSIS — Z9689 Presence of other specified functional implants: Secondary | ICD-10-CM

## 2015-07-01 DIAGNOSIS — R079 Chest pain, unspecified: Secondary | ICD-10-CM | POA: Diagnosis present

## 2015-07-01 DIAGNOSIS — R222 Localized swelling, mass and lump, trunk: Secondary | ICD-10-CM | POA: Diagnosis not present

## 2015-07-01 DIAGNOSIS — D62 Acute posthemorrhagic anemia: Secondary | ICD-10-CM | POA: Diagnosis not present

## 2015-07-01 DIAGNOSIS — Z23 Encounter for immunization: Secondary | ICD-10-CM

## 2015-07-01 DIAGNOSIS — F17210 Nicotine dependence, cigarettes, uncomplicated: Secondary | ICD-10-CM | POA: Diagnosis present

## 2015-07-01 DIAGNOSIS — J9382 Other air leak: Secondary | ICD-10-CM | POA: Diagnosis not present

## 2015-07-01 DIAGNOSIS — R911 Solitary pulmonary nodule: Secondary | ICD-10-CM

## 2015-07-01 DIAGNOSIS — Z7982 Long term (current) use of aspirin: Secondary | ICD-10-CM | POA: Diagnosis not present

## 2015-07-01 DIAGNOSIS — I10 Essential (primary) hypertension: Secondary | ICD-10-CM | POA: Diagnosis not present

## 2015-07-01 DIAGNOSIS — Z79899 Other long term (current) drug therapy: Secondary | ICD-10-CM | POA: Diagnosis not present

## 2015-07-01 DIAGNOSIS — Z9889 Other specified postprocedural states: Secondary | ICD-10-CM

## 2015-07-01 DIAGNOSIS — J939 Pneumothorax, unspecified: Secondary | ICD-10-CM

## 2015-07-01 HISTORY — PX: VIDEO BRONCHOSCOPY: SHX5072

## 2015-07-01 HISTORY — PX: THORACOTOMY/LOBECTOMY: SHX6116

## 2015-07-01 LAB — GLUCOSE, CAPILLARY
GLUCOSE-CAPILLARY: 140 mg/dL — AB (ref 65–99)
GLUCOSE-CAPILLARY: 122 mg/dL — AB (ref 65–99)
Glucose-Capillary: 120 mg/dL — ABNORMAL HIGH (ref 65–99)
Glucose-Capillary: 139 mg/dL — ABNORMAL HIGH (ref 65–99)

## 2015-07-01 SURGERY — BRONCHOSCOPY, VIDEO-ASSISTED
Anesthesia: General | Laterality: Right

## 2015-07-01 MED ORDER — OXYCODONE HCL 5 MG PO TABS
5.0000 mg | ORAL_TABLET | ORAL | Status: DC | PRN
  Administered 2015-07-04: 5 mg via ORAL
  Administered 2015-07-05 – 2015-07-06 (×4): 10 mg via ORAL
  Administered 2015-07-06: 5 mg via ORAL
  Administered 2015-07-06: 10 mg via ORAL
  Administered 2015-07-07 (×2): 5 mg via ORAL
  Filled 2015-07-01 (×2): qty 2
  Filled 2015-07-01 (×2): qty 1
  Filled 2015-07-01: qty 2
  Filled 2015-07-01: qty 1
  Filled 2015-07-01 (×2): qty 2
  Filled 2015-07-01: qty 1

## 2015-07-01 MED ORDER — EPHEDRINE 5 MG/ML INJ
INTRAVENOUS | Status: AC
  Filled 2015-07-01: qty 10

## 2015-07-01 MED ORDER — FENTANYL CITRATE (PF) 250 MCG/5ML IJ SOLN
INTRAMUSCULAR | Status: AC
  Filled 2015-07-01: qty 5

## 2015-07-01 MED ORDER — ACETAMINOPHEN 500 MG PO TABS
1000.0000 mg | ORAL_TABLET | Freq: Four times a day (QID) | ORAL | Status: AC
  Administered 2015-07-01 – 2015-07-06 (×18): 1000 mg via ORAL
  Filled 2015-07-01 (×18): qty 2

## 2015-07-01 MED ORDER — DIPHENHYDRAMINE HCL 50 MG/ML IJ SOLN: 12.5000 mg | Freq: Four times a day (QID) | INTRAMUSCULAR | Status: DC | PRN

## 2015-07-01 MED ORDER — FENTANYL 40 MCG/ML IV SOLN
INTRAVENOUS | Status: DC
  Administered 2015-07-01: 45 ug via INTRAVENOUS
  Administered 2015-07-01: 210 ug via INTRAVENOUS
  Administered 2015-07-01 – 2015-07-02 (×2): via INTRAVENOUS
  Administered 2015-07-02: 180 ug via INTRAVENOUS
  Administered 2015-07-02: 255 ug via INTRAVENOUS
  Administered 2015-07-02: 90 ug via INTRAVENOUS
  Administered 2015-07-02: 165 ug via INTRAVENOUS
  Administered 2015-07-02: 90 ug via INTRAVENOUS
  Administered 2015-07-02: 165 ug via INTRAVENOUS
  Administered 2015-07-03: 75 ug via INTRAVENOUS
  Administered 2015-07-03: 30 ug via INTRAVENOUS
  Administered 2015-07-03: 60 ug via INTRAVENOUS
  Administered 2015-07-03: 75 ug via INTRAVENOUS
  Administered 2015-07-03: 60 ug via INTRAVENOUS
  Administered 2015-07-03: 30 ug via INTRAVENOUS
  Administered 2015-07-04 (×2): 45 ug via INTRAVENOUS
  Administered 2015-07-04: 30 ug via INTRAVENOUS
  Filled 2015-07-01: qty 25

## 2015-07-01 MED ORDER — MEPERIDINE HCL 25 MG/ML IJ SOLN: 6.2500 mg | INTRAMUSCULAR | Status: DC | PRN

## 2015-07-01 MED ORDER — FENTANYL CITRATE (PF) 250 MCG/5ML IJ SOLN: INTRAMUSCULAR | Status: DC | PRN

## 2015-07-01 MED ORDER — LEVALBUTEROL HCL 0.63 MG/3ML IN NEBU: 0.6300 mg | INHALATION_SOLUTION | Freq: Four times a day (QID) | RESPIRATORY_TRACT | Status: DC | PRN

## 2015-07-01 MED ORDER — MIDAZOLAM HCL 5 MG/5ML IJ SOLN: INTRAMUSCULAR | Status: DC | PRN

## 2015-07-01 MED ORDER — 0.9 % SODIUM CHLORIDE (POUR BTL) OPTIME
TOPICAL | Status: DC | PRN
  Administered 2015-07-01: 2000 mL
  Administered 2015-07-01: 1000 mL

## 2015-07-01 MED ORDER — SODIUM CHLORIDE 0.9% FLUSH: 9.0000 mL | INTRAVENOUS | Status: DC | PRN

## 2015-07-01 MED ORDER — BUPIVACAINE HCL (PF) 0.5 % IJ SOLN
INTRAMUSCULAR | Status: AC
  Filled 2015-07-01: qty 30

## 2015-07-01 MED ORDER — FENTANYL 40 MCG/ML IV SOLN
INTRAVENOUS | Status: AC
  Filled 2015-07-01: qty 25

## 2015-07-01 MED ORDER — LEVOFLOXACIN IN D5W 500 MG/100ML IV SOLN
500.0000 mg | INTRAVENOUS | Status: AC
  Administered 2015-07-01: 500 mg via INTRAVENOUS
  Filled 2015-07-01: qty 100

## 2015-07-01 MED ORDER — METOCLOPRAMIDE HCL 5 MG/ML IJ SOLN
10.0000 mg | Freq: Four times a day (QID) | INTRAMUSCULAR | Status: AC
  Administered 2015-07-01 – 2015-07-03 (×8): 10 mg via INTRAVENOUS
  Filled 2015-07-01 (×8): qty 2

## 2015-07-01 MED ORDER — DIPHENHYDRAMINE HCL 12.5 MG/5ML PO ELIX: 12.5000 mg | ORAL_SOLUTION | Freq: Four times a day (QID) | ORAL | Status: DC | PRN

## 2015-07-01 MED ORDER — PROPOFOL 10 MG/ML IV BOLUS
INTRAVENOUS | Status: AC
  Filled 2015-07-01: qty 20

## 2015-07-01 MED ORDER — LEVOFLOXACIN IN D5W 750 MG/150ML IV SOLN
750.0000 mg | Freq: Once | INTRAVENOUS | Status: AC
  Administered 2015-07-02: 750 mg via INTRAVENOUS
  Filled 2015-07-01: qty 150

## 2015-07-01 MED ORDER — LEVALBUTEROL HCL 0.63 MG/3ML IN NEBU
0.6300 mg | INHALATION_SOLUTION | Freq: Two times a day (BID) | RESPIRATORY_TRACT | Status: DC
  Administered 2015-07-02 – 2015-07-03 (×2): 0.63 mg via RESPIRATORY_TRACT
  Filled 2015-07-01 (×2): qty 3

## 2015-07-01 MED ORDER — LIDOCAINE 2% (20 MG/ML) 5 ML SYRINGE
INTRAMUSCULAR | Status: AC
  Filled 2015-07-01: qty 5

## 2015-07-01 MED ORDER — ROCURONIUM BROMIDE 100 MG/10ML IV SOLN
INTRAVENOUS | Status: DC | PRN
  Administered 2015-07-01 (×2): 40 mg via INTRAVENOUS

## 2015-07-01 MED ORDER — ACETAMINOPHEN 160 MG/5ML PO SOLN
1000.0000 mg | Freq: Four times a day (QID) | ORAL | Status: AC
  Administered 2015-07-06: 1000 mg via ORAL

## 2015-07-01 MED ORDER — CETYLPYRIDINIUM CHLORIDE 0.05 % MT LIQD
7.0000 mL | Freq: Two times a day (BID) | OROMUCOSAL | Status: DC
Start: 2015-07-01 — End: 2015-07-05
  Administered 2015-07-01 – 2015-07-05 (×7): 7 mL via OROMUCOSAL

## 2015-07-01 MED ORDER — EPHEDRINE SULFATE 50 MG/ML IJ SOLN
INTRAMUSCULAR | Status: DC | PRN
  Administered 2015-07-01: 15 mg via INTRAVENOUS

## 2015-07-01 MED ORDER — SUCCINYLCHOLINE CHLORIDE 200 MG/10ML IV SOSY
PREFILLED_SYRINGE | INTRAVENOUS | Status: AC
  Filled 2015-07-01: qty 10

## 2015-07-01 MED ORDER — ONDANSETRON HCL 4 MG/2ML IJ SOLN
4.0000 mg | Freq: Four times a day (QID) | INTRAMUSCULAR | Status: DC | PRN
  Administered 2015-07-02: 4 mg via INTRAVENOUS

## 2015-07-01 MED ORDER — NALOXONE HCL 0.4 MG/ML IJ SOLN: 0.4000 mg | INTRAMUSCULAR | Status: DC | PRN

## 2015-07-01 MED ORDER — ONDANSETRON HCL 4 MG/2ML IJ SOLN
4.0000 mg | Freq: Four times a day (QID) | INTRAMUSCULAR | Status: DC | PRN
  Filled 2015-07-01: qty 2

## 2015-07-01 MED ORDER — ATORVASTATIN CALCIUM 40 MG PO TABS
40.0000 mg | ORAL_TABLET | Freq: Every day | ORAL | Status: DC
  Administered 2015-07-01 – 2015-07-07 (×7): 40 mg via ORAL
  Filled 2015-07-01 (×7): qty 1

## 2015-07-01 MED ORDER — FENTANYL CITRATE (PF) 100 MCG/2ML IJ SOLN
INTRAMUSCULAR | Status: AC
  Administered 2015-07-01 (×2): 50 ug via INTRAVENOUS
  Filled 2015-07-01: qty 2

## 2015-07-01 MED ORDER — LACTATED RINGERS IV SOLN
INTRAVENOUS | Status: DC | PRN
  Administered 2015-07-01: 07:00:00 via INTRAVENOUS

## 2015-07-01 MED ORDER — PROPOFOL 10 MG/ML IV BOLUS
INTRAVENOUS | Status: DC | PRN
  Administered 2015-07-01: 130 mg via INTRAVENOUS

## 2015-07-01 MED ORDER — PROCHLORPERAZINE EDISYLATE 5 MG/ML IJ SOLN: 10.0000 mg | INTRAMUSCULAR | Status: DC | PRN

## 2015-07-01 MED ORDER — SENNOSIDES-DOCUSATE SODIUM 8.6-50 MG PO TABS
1.0000 | ORAL_TABLET | Freq: Every day | ORAL | Status: DC
  Administered 2015-07-01 – 2015-07-05 (×3): 1 via ORAL
  Filled 2015-07-01 (×3): qty 1

## 2015-07-01 MED ORDER — BISACODYL 5 MG PO TBEC
10.0000 mg | DELAYED_RELEASE_TABLET | Freq: Every day | ORAL | Status: DC
  Administered 2015-07-02 – 2015-07-06 (×3): 10 mg via ORAL
  Filled 2015-07-01 (×3): qty 2

## 2015-07-01 MED ORDER — LACTATED RINGERS IV SOLN: INTRAVENOUS | Status: DC

## 2015-07-01 MED ORDER — LEVALBUTEROL HCL 0.63 MG/3ML IN NEBU
0.6300 mg | INHALATION_SOLUTION | Freq: Four times a day (QID) | RESPIRATORY_TRACT | Status: DC
  Administered 2015-07-01: 0.63 mg via RESPIRATORY_TRACT

## 2015-07-01 MED ORDER — INSULIN ASPART 100 UNIT/ML ~~LOC~~ SOLN
0.0000 [IU] | SUBCUTANEOUS | Status: DC
  Administered 2015-07-01 – 2015-07-02 (×4): 2 [IU] via SUBCUTANEOUS

## 2015-07-01 MED ORDER — KETOROLAC TROMETHAMINE 15 MG/ML IJ SOLN: 15.0000 mg | Freq: Four times a day (QID) | INTRAMUSCULAR | Status: DC | PRN

## 2015-07-01 MED ORDER — VANCOMYCIN HCL IN DEXTROSE 1-5 GM/200ML-% IV SOLN
1000.0000 mg | INTRAVENOUS | Status: AC
  Administered 2015-07-01: 1000 mg via INTRAVENOUS
  Filled 2015-07-01: qty 200

## 2015-07-01 MED ORDER — IPRATROPIUM BROMIDE 0.02 % IN SOLN
0.5000 mg | Freq: Two times a day (BID) | RESPIRATORY_TRACT | Status: DC
  Administered 2015-07-01 – 2015-07-03 (×3): 0.5 mg via RESPIRATORY_TRACT
  Filled 2015-07-01 (×3): qty 2.5

## 2015-07-01 MED ORDER — PHENYLEPHRINE HCL 10 MG/ML IJ SOLN
10.0000 mg | INTRAVENOUS | Status: DC | PRN
  Administered 2015-07-01: 20 ug/min via INTRAVENOUS

## 2015-07-01 MED ORDER — HYDROMORPHONE HCL 1 MG/ML IJ SOLN
INTRAMUSCULAR | Status: AC
  Filled 2015-07-01: qty 1

## 2015-07-01 MED ORDER — SUGAMMADEX SODIUM 200 MG/2ML IV SOLN
INTRAVENOUS | Status: DC | PRN
  Administered 2015-07-01: 150 mg via INTRAVENOUS

## 2015-07-01 MED ORDER — ONDANSETRON HCL 4 MG/2ML IJ SOLN
INTRAMUSCULAR | Status: DC | PRN
  Administered 2015-07-01: 4 mg via INTRAVENOUS

## 2015-07-01 MED ORDER — MIDAZOLAM HCL 2 MG/2ML IJ SOLN
INTRAMUSCULAR | Status: AC
  Administered 2015-07-01: 2 mg via INTRAVENOUS
  Filled 2015-07-01: qty 2

## 2015-07-01 MED ORDER — TRAMADOL HCL 50 MG PO TABS: 50.0000 mg | ORAL_TABLET | Freq: Four times a day (QID) | ORAL | Status: DC | PRN

## 2015-07-01 MED ORDER — PHENYLEPHRINE 40 MCG/ML (10ML) SYRINGE FOR IV PUSH (FOR BLOOD PRESSURE SUPPORT)
PREFILLED_SYRINGE | INTRAVENOUS | Status: AC
  Filled 2015-07-01: qty 10

## 2015-07-01 MED ORDER — HYDROMORPHONE HCL 1 MG/ML IJ SOLN
0.2500 mg | INTRAMUSCULAR | Status: DC | PRN
  Administered 2015-07-01: 0.5 mg via INTRAVENOUS

## 2015-07-01 MED ORDER — POTASSIUM CHLORIDE 10 MEQ/50ML IV SOLN
10.0000 meq | Freq: Every day | INTRAVENOUS | Status: DC | PRN
  Administered 2015-07-03 – 2015-07-05 (×6): 10 meq via INTRAVENOUS
  Filled 2015-07-01 (×3): qty 50

## 2015-07-01 MED ORDER — FENTANYL CITRATE (PF) 250 MCG/5ML IJ SOLN
INTRAMUSCULAR | Status: DC | PRN
  Administered 2015-07-01 (×3): 50 ug via INTRAVENOUS
  Administered 2015-07-01 (×2): 100 ug via INTRAVENOUS
  Administered 2015-07-01 (×3): 50 ug via INTRAVENOUS

## 2015-07-01 MED ORDER — DEXTROSE IN LACTATED RINGERS 5 % IV SOLN
INTRAVENOUS | Status: DC
Start: 2015-07-01 — End: 2015-07-06
  Administered 2015-07-01: 19:00:00 via INTRAVENOUS
  Administered 2015-07-02: 75 mL/h via INTRAVENOUS
  Administered 2015-07-03 – 2015-07-05 (×2): via INTRAVENOUS

## 2015-07-01 MED ORDER — MIDAZOLAM HCL 2 MG/2ML IJ SOLN
INTRAMUSCULAR | Status: AC
  Filled 2015-07-01: qty 2

## 2015-07-01 MED ORDER — LEVALBUTEROL HCL 0.63 MG/3ML IN NEBU
INHALATION_SOLUTION | RESPIRATORY_TRACT | Status: AC
  Filled 2015-07-01: qty 3

## 2015-07-01 MED ORDER — VANCOMYCIN HCL IN DEXTROSE 1-5 GM/200ML-% IV SOLN
1000.0000 mg | Freq: Two times a day (BID) | INTRAVENOUS | Status: AC
  Administered 2015-07-01: 1000 mg via INTRAVENOUS
  Filled 2015-07-01: qty 200

## 2015-07-01 SURGICAL SUPPLY — 96 items
BIT DRILL 7/64X5 DISP (BIT) ×4 IMPLANT
BLADE SURG 11 STRL SS (BLADE) IMPLANT
BRUSH CYTOL CELLEBRITY 1.5X140 (MISCELLANEOUS) IMPLANT
CANISTER SUCTION 2500CC (MISCELLANEOUS) ×4 IMPLANT
CATH KIT ON Q 5IN SLV (PAIN MANAGEMENT) IMPLANT
CATH THORACIC 28FR (CATHETERS) ×8 IMPLANT
CATH THORACIC 36FR (CATHETERS) IMPLANT
CATH THORACIC 36FR RT ANG (CATHETERS) IMPLANT
CLIP TI MEDIUM 24 (CLIP) ×8 IMPLANT
CONN Y 3/8X3/8X3/8  BEN (MISCELLANEOUS) ×2
CONN Y 3/8X3/8X3/8 BEN (MISCELLANEOUS) ×2 IMPLANT
CONT SPEC 4OZ CLIKSEAL STRL BL (MISCELLANEOUS) ×16 IMPLANT
COTTONBALL LRG STERILE PKG (GAUZE/BANDAGES/DRESSINGS) IMPLANT
COVER SURGICAL LIGHT HANDLE (MISCELLANEOUS) IMPLANT
COVER TABLE BACK 60X90 (DRAPES) IMPLANT
DERMABOND ADVANCED (GAUZE/BANDAGES/DRESSINGS) ×2
DERMABOND ADVANCED .7 DNX12 (GAUZE/BANDAGES/DRESSINGS) ×2 IMPLANT
DRAPE LAPAROSCOPIC ABDOMINAL (DRAPES) ×4 IMPLANT
DRAPE WARM FLUID 44X44 (DRAPE) IMPLANT
ELECT BLADE 6.5 EXT (BLADE) ×8 IMPLANT
ELECT REM PT RETURN 9FT ADLT (ELECTROSURGICAL) ×4
ELECTRODE REM PT RTRN 9FT ADLT (ELECTROSURGICAL) ×2 IMPLANT
FORCEPS BIOP RJ4 1.8 (CUTTING FORCEPS) IMPLANT
GAUZE SPONGE 4X4 12PLY STRL (GAUZE/BANDAGES/DRESSINGS) ×8 IMPLANT
GLOVE BIO SURGEON STRL SZ 6.5 (GLOVE) ×3 IMPLANT
GLOVE BIO SURGEONS STRL SZ 6.5 (GLOVE) ×1
GLOVE BIOGEL PI IND STRL 6.5 (GLOVE) ×6 IMPLANT
GLOVE BIOGEL PI IND STRL 7.0 (GLOVE) ×4 IMPLANT
GLOVE BIOGEL PI INDICATOR 6.5 (GLOVE) ×6
GLOVE BIOGEL PI INDICATOR 7.0 (GLOVE) ×4
GLOVE ECLIPSE 6.5 STRL STRAW (GLOVE) ×4 IMPLANT
GLOVE EUDERMIC 7 POWDERFREE (GLOVE) ×12 IMPLANT
GOWN STRL REUS W/ TWL LRG LVL3 (GOWN DISPOSABLE) ×6 IMPLANT
GOWN STRL REUS W/ TWL XL LVL3 (GOWN DISPOSABLE) ×2 IMPLANT
GOWN STRL REUS W/TWL LRG LVL3 (GOWN DISPOSABLE) ×6
GOWN STRL REUS W/TWL XL LVL3 (GOWN DISPOSABLE) ×2
HANDLE STAPLE ENDO GIA SHORT (STAPLE) ×2
KIT BASIN OR (CUSTOM PROCEDURE TRAY) ×4 IMPLANT
KIT CLEAN ENDO COMPLIANCE (KITS) ×4 IMPLANT
KIT ROOM TURNOVER OR (KITS) ×8 IMPLANT
MARKER SKIN DUAL TIP RULER LAB (MISCELLANEOUS) ×4 IMPLANT
NEEDLE 22X1 1/2 (OR ONLY) (NEEDLE) IMPLANT
NEEDLE BIOPSY TRANSBRONCH 21G (NEEDLE) IMPLANT
NS IRRIG 1000ML POUR BTL (IV SOLUTION) ×12 IMPLANT
OIL SILICONE PENTAX (PARTS (SERVICE/REPAIRS)) ×4 IMPLANT
PACK CHEST (CUSTOM PROCEDURE TRAY) ×4 IMPLANT
PAD ARMBOARD 7.5X6 YLW CONV (MISCELLANEOUS) ×16 IMPLANT
POUCH ENDO CATCH II 15MM (MISCELLANEOUS) ×4 IMPLANT
RELOAD EGIA 60 MED/THCK PURPLE (STAPLE) ×4 IMPLANT
RELOAD EGIA TRIS TAN 45 CVD (STAPLE) ×12 IMPLANT
RELOAD PROXIMATE 30MM GREEN (ENDOMECHANICALS) ×4 IMPLANT
RELOAD TRI 2.0 60 XTHK VAS SUL (STAPLE) ×12 IMPLANT
SEALANT SURG COSEAL 4ML (VASCULAR PRODUCTS) IMPLANT
SEALANT SURG COSEAL 8ML (VASCULAR PRODUCTS) IMPLANT
SOLUTION ANTI FOG 6CC (MISCELLANEOUS) ×4 IMPLANT
SPECIMEN JAR MEDIUM (MISCELLANEOUS) ×4 IMPLANT
SPONGE GAUZE 4X4 12PLY STER LF (GAUZE/BANDAGES/DRESSINGS) ×8 IMPLANT
STAPLER ENDO GIA 12MM SHORT (STAPLE) ×2 IMPLANT
STAPLER TA30 4.8 NON-ABS (STAPLE) ×8 IMPLANT
SUT PROLENE 3 0 SH DA (SUTURE) IMPLANT
SUT PROLENE 4 0 RB 1 (SUTURE)
SUT PROLENE 4-0 RB1 .5 CRCL 36 (SUTURE) IMPLANT
SUT SILK  1 MH (SUTURE) ×4
SUT SILK 1 MH (SUTURE) ×4 IMPLANT
SUT SILK 1 TIES 10X30 (SUTURE) IMPLANT
SUT SILK 2 0SH CR/8 30 (SUTURE) ×8 IMPLANT
SUT SILK 3 0 SH CR/8 (SUTURE) IMPLANT
SUT VIC AB 1 CTX 36 (SUTURE) ×4
SUT VIC AB 1 CTX36XBRD ANBCTR (SUTURE) ×4 IMPLANT
SUT VIC AB 2 TP1 27 (SUTURE) ×4 IMPLANT
SUT VIC AB 2-0 CT1 27 (SUTURE)
SUT VIC AB 2-0 CT1 TAPERPNT 27 (SUTURE) IMPLANT
SUT VIC AB 2-0 CTX 36 (SUTURE) ×16 IMPLANT
SUT VIC AB 2-0 UR6 27 (SUTURE) IMPLANT
SUT VIC AB 3-0 MH 27 (SUTURE) IMPLANT
SUT VIC AB 3-0 SH 27 (SUTURE)
SUT VIC AB 3-0 SH 27X BRD (SUTURE) IMPLANT
SUT VIC AB 3-0 X1 27 (SUTURE) ×8 IMPLANT
SUT VICRYL 2 TP 1 (SUTURE) ×8 IMPLANT
SYR 20ML ECCENTRIC (SYRINGE) ×4 IMPLANT
SYR 5ML LUER SLIP (SYRINGE) ×4 IMPLANT
SYR CONTROL 10ML LL (SYRINGE) ×4 IMPLANT
SYSTEM SAHARA CHEST DRAIN ATS (WOUND CARE) ×4 IMPLANT
SYSTEM SAHARA CHEST DRAIN RE-I (WOUND CARE) ×4 IMPLANT
TAPE CLOTH SURG 4X10 WHT LF (GAUZE/BANDAGES/DRESSINGS) ×8 IMPLANT
TAPE UMBILICAL 1/8 X36 TWILL (MISCELLANEOUS) IMPLANT
TAPE UMBILICAL COTTON 1/8X30 (MISCELLANEOUS) ×4 IMPLANT
TIP APPLICATOR SPRAY EXTEND 16 (VASCULAR PRODUCTS) IMPLANT
TOWEL OR 17X24 6PK STRL BLUE (TOWEL DISPOSABLE) ×8 IMPLANT
TOWEL OR 17X26 10 PK STRL BLUE (TOWEL DISPOSABLE) ×4 IMPLANT
TRAP SPECIMEN MUCOUS 40CC (MISCELLANEOUS) IMPLANT
TRAY FOLEY CATH 16FRSI W/METER (SET/KITS/TRAYS/PACK) ×4 IMPLANT
TUBE CONNECTING 20'X1/4 (TUBING) ×1
TUBE CONNECTING 20X1/4 (TUBING) ×3 IMPLANT
TUNNELER SHEATH ON-Q 11GX8 DSP (PAIN MANAGEMENT) IMPLANT
WATER STERILE IRR 1000ML POUR (IV SOLUTION) ×4 IMPLANT

## 2015-07-01 NOTE — Anesthesia Postprocedure Evaluation (Signed)
Anesthesia Post Note  Patient: Azariya Freeman  Procedure(s) Performed: Procedure(s) (LRB): VIDEO BRONCHOSCOPY (N/A) THORACOTOMY/RIGHT LOWER LOBECTOMY (Right)  Patient location during evaluation: PACU Anesthesia Type: General Level of consciousness: awake and alert Pain management: pain level controlled Vital Signs Assessment: post-procedure vital signs reviewed and stable Respiratory status: spontaneous breathing, nonlabored ventilation, respiratory function stable and patient connected to nasal cannula oxygen Cardiovascular status: blood pressure returned to baseline and stable Postop Assessment: no signs of nausea or vomiting Anesthetic complications: no    Last Vitals:  Filed Vitals:   07/01/15 1245 07/01/15 1300  BP: 82/45 90/48  Pulse: 66 65  Temp:  36.6 C  Resp: 13 18    Last Pain:  Filed Vitals:   07/01/15 1335  PainSc: Asleep                 Effie Berkshire

## 2015-07-01 NOTE — Interval H&P Note (Signed)
History and Physical Interval Note:  07/01/2015 5:57 AM  Pamela Mueller  has presented today for surgery, with the diagnosis of RLL LESION  The various methods of treatment have been discussed with the patient and family. After consideration of risks, benefits and other options for treatment, the patient has consented to  Procedure(s): VIDEO BRONCHOSCOPY (N/A) THORACOTOMY/RIGHT LOWER LOBECTOMY (Right) as a surgical intervention .  The patient's history has been reviewed, patient examined, no change in status, stable for surgery.  I have reviewed the patient's chart and labs.  Questions were answered to the patient's satisfaction.     Gaye Pollack

## 2015-07-01 NOTE — Op Note (Signed)
CARDIOTHORACIC SURGERY OPERATIVE NOTE 07/01/2015 Pamela Mueller [id number]  Surgeon: Gaye Pollack, MD  First Assistant: Ellwood Handler, PA-C    Preoperative Diagnosis: Right lower lobe lung mass  Postoperative Diagnosis: Same   Procedure:  1. Flexible fiberoptic bronchoscopy 2. Right muscle-sparing thoracotomy 3. Right lower lobectomy   Anesthesia: General Endotracheal   Clinical History/Surgical Indication:  The patient is a 75 year old active heavy smoker who presented with some left upper chest pain radiating through to the back associated with cough productive of phlegm. She had a CXR on 04/13/2015 showing focal opacification over the right base. A CT scan the same day showed a 1.8 x 2.8 spiculated mass in the RLL just above the diaphragm. There was also an area of nodular consolidation abutting the pleura over the anterior medial lingula with adjacent reticulonodular opacification. There was a patchy area of reticulonodular opacification over the anterior left upper lobe. There was a 1 cm precarinal node and a few other small mediastinal nodes but no hilar adenopathy. A PET scan on 04/21/2015 showed the RLL mass to be hypermetabolic with an SUV of 32.95. The 3.2 x3.5 cm lingular left upper lobe part solid mass had an SUV of 5.95 which is in the malignant range. There was no hypermetabolic mediastinal or hilar adenopathy and no hypermetabolic activity anywhere else. Her left chest pain resolved quickly and has not recurred. She underwent bronchoscopy by Dr. Verdie Mosher which was unremarkable. The biopsy of the RLL showed acute and chronic inflammation with no malignancy.  She had limited spirometry performed in Dr. Marlana Salvage office which showed an FEV1 of 1.0 ( 56% predicted). She is 124 lbs with a BSA of 1.58 m2.  She had a stress echo treadmill test on 04/14/2015 which was a submaximal test with a blunted heart rate response but was negative for ischemia with a normal EF.  When  I saw her initially on 05/05/2015 I ordered a repeat CT scan to evaluate the left upper lobe partially solid mass that was hypermetabolic on PET scan. The lesion was smaller consistent with a resolving infectious or inflammatory process. Biopsy was therefore deferred. The 2.7 cm spiculated mass in the RLL was unchanged and this is intensely hypermetabolic on PET scan. Her PFT's show a moderate obstructive defect with an FEV1 of 1.3 ( 64% predicted) and a moderately severe diffusion defect ( 52% predicted) There was no response to bronchodilators. She continued to smoke 1-1/2 packs per day. I felt that her PFT's were probably adequate to tolerate a lower lobectomy if she quit smoking. She quit smoking after our visit on 05/05/2015 and has felt much better. She has continued to work at [employer] and has remained active.   She has a 2.8 cm spiculated mass in the RLL that is very hypermetabolic on PET scan and certainly a lung cancer. There is no hypermetabolic hilar or mediastinal adenopathy. The left lung nodule is smaller and is most likely a resolving infectious or inflammatory process. I think her PFT's are adequate to tolerate a right lower lobectomy and she has quit smoking for almost two months. I discussed the surgery with her and her daughters including the alternative of biopsy and XRT. I discussed benefits and risks including but not limited to bleeding, blood transfusion, infection, respiratory complications, prolonged air leak and pleural space complications as well as the risk of recurrent cancer. She understands and agrees to proceed.   Preparation:  The patient was seen in the preoperative holding area and the correct  patient, correct operation were confirmed with the patient after reviewing the medical record and xrays. The right side of the chest was signed by me. The consent was signed by me. Preoperative antibiotics were given. A radial arterial line was placed by the anesthesia team. The  patient was taken back to the operating room and positioned supine on the operating room table. After being placed under general endotracheal anesthesia by the anesthesia team using a single lumen tube a foley catheter was placed. Lower extremity SCD's were placed. A time out was taken and the correct patient, operation and operative side were confirmed with nursing and anesthesia staff.  A surgical time-out was taken and the correct patient and operative procedure and operative side were confirmed with the nursing and anesthesia staff.  Flexible fiberoptic bronchoscopy  The bronchoscope was advanced down the ETT. The distal trachea was normal. The carina was sharp. The left and right bronchial trees had normal segmental anatomy with no endobronchial lesions or sign of extrinsic compression. The scope was removed and the single lumen ETT converted to a double lumen tube by anesthesia.   The patient was turned into the left lateral decubitus position with the right side up. The chest was prepped with betadine soap and solution and draped in the usual sterile manner. A surgical time-out was taken and the correct patient and operative procedure and operative side were confirmed with the nursing and anesthesia staff.    Right muscle-sparing thoracotomy:  The chest was entered through a lateral muscle-sparing thoracotomy incision. The pleural space was entered through the 4th ICS. The pleural space was unremarkable. The upper and middle lobes were normal with no palpable masses. The mass was palpable in the basilar portion of the lower lobe and there was visible umbilication on the diaphragmatic surface of the lobe. The fissures were both incomplete. The major fissure was opened. The superior segmental and basilar segmental arteries were exposed and encircled with tapes. They were divided using a vascular stapler. The inferior pulmonary ligament was divided up to the inferior pulmonary vein and it was  encircled and divided using a stapler. The incomplete fissure between the upper and lower lobe posteriorly was divided using a stapler. The incomplete fissure anteriorly between the middle and lower lobes was divided using a stapler. The lower lobe bronchus was encircled with a tape. A TA-30 stapler with 4.8 mm staples was passed around it and closed. The right lung was inflated and the upper and middle lobes fully expanded. The stapler was fired and the bronchial stump transected distal to the stapler. The chest was filled with saline and the bronchial stump tested at 30 cm pressure. There was no air leak. Hemostasis was complete.  Completion:   Two 28 F chest tubes were placed in the pleural space posteriorly and anteriorly. The ribs were reapproximated with # 2 vicryl pericostal sutures with the sutures placed through holes drilled in the 5th rib and placed around the 4th rib. The muscles were returned to their normal anatomic position. The subcutaneous tissue was closed with 2-0 vicryl suture and the skin with 3-0 vicryl subcuticular suture. The sponge, needle, and instrument counts were correct according to the nurses. Dry sterile dressings were applied and the chest tubes were connected to pleurevac suction. The patient was turned into the supine position, extubated, and transferred to the PACU in satisfactory and stable condition.

## 2015-07-01 NOTE — Progress Notes (Signed)
Patient ID: Pamela Mueller, female   DOB: 07/19/1940, 75 y.o.   MRN: [id number]   SICU Evening Rounds:   Hemodynamically stable  Awake and alert. Pain under control on PCA. Daughters here.  Urine output good  CT output low  CBC    Component Value Date/Time   WBC 5.0 06/26/2015 1601   RBC 4.17 06/26/2015 1601   HGB 12.6 06/26/2015 1601   HCT 37.1 06/26/2015 1601   PLT 161 06/26/2015 1601   MCV 89.0 06/26/2015 1601   MCH 30.2 06/26/2015 1601   MCHC 34.0 06/26/2015 1601   RDW 13.1 06/26/2015 1601     BMET    Component Value Date/Time   NA 140 06/26/2015 1601   K 4.0 06/26/2015 1601   CL 107 06/26/2015 1601   CO2 22 06/26/2015 1601   GLUCOSE 100* 06/26/2015 1601   BUN 10 06/26/2015 1601   CREATININE 0.55 06/26/2015 1601   CALCIUM 9.3 06/26/2015 1601   GFRNONAA >60 06/26/2015 1601   GFRAA >60 06/26/2015 1601     A/P:  Stable postop course. Continue current plans

## 2015-07-01 NOTE — Transfer of Care (Signed)
Immediate Anesthesia Transfer of Care Note  Patient: Pamela Mueller  Procedure(s) Performed: Procedure(s): VIDEO BRONCHOSCOPY (N/A) THORACOTOMY/RIGHT LOWER LOBECTOMY (Right)  Patient Location: PACU  Anesthesia Type:General  Level of Consciousness: awake, alert  and responds to stimulation  Airway & Oxygen Therapy: Patient Spontanous Breathing and Patient connected to nasal cannula oxygen  Post-op Assessment: Report given to RN and Post -op Vital signs reviewed and stable  Post vital signs: Reviewed and stable  Last Vitals:  Filed Vitals:   07/01/15 0548  BP: 146/65  Pulse: 66  Temp: 36.5 C  Resp: 20    Last Pain: There were no vitals filed for this visit.    Patients Stated Pain Goal: 2 (27/07/86 7544)  Complications: No apparent anesthesia complications

## 2015-07-01 NOTE — Anesthesia Procedure Notes (Addendum)
Procedure Name: Intubation Date/Time: 07/01/2015 7:50 AM Performed by: Salli Quarry Andre Gallego Pre-anesthesia Checklist: Patient identified, Emergency Drugs available, Suction available and Patient being monitored Patient Re-evaluated:Patient Re-evaluated prior to inductionOxygen Delivery Method: Circle System Utilized Preoxygenation: Pre-oxygenation with 100% oxygen Intubation Type: IV induction Ventilation: Mask ventilation without difficulty and Oral airway inserted - appropriate to patient size Laryngoscope Size: Mac and 3 Grade View: Grade I Tube type: Oral Tube size: 8.5 mm Number of attempts: 1 Airway Equipment and Method: Stylet and Oral airway Placement Confirmation: ETT inserted through vocal cords under direct vision,  positive ETCO2 and breath sounds checked- equal and bilateral Secured at: 22 cm Tube secured with: Tape Dental Injury: Teeth and Oropharynx as per pre-operative assessment     Procedure Name: Intubation Date/Time: 07/01/2015 8:18 AM Performed by: Salli Quarry Nariyah Osias Pre-anesthesia Checklist: Patient identified, Emergency Drugs available, Suction available and Patient being monitored Patient Re-evaluated:Patient Re-evaluated prior to inductionOxygen Delivery Method: Circle System Utilized Preoxygenation: Pre-oxygenation with 100% oxygen Laryngoscope Size: Mac and 3 Grade View: Grade I Tube type: Oral Endobronchial tube: Double lumen EBT, EBT position confirmed by auscultation, EBT position confirmed by fiberoptic bronchoscope and Left and 37 Fr Number of attempts: 1 Airway Equipment and Method: Stylet Placement Confirmation: ETT inserted through vocal cords under direct vision,  positive ETCO2 and breath sounds checked- equal and bilateral Secured at: 25 cm Tube secured with: Tape Dental Injury: Teeth and Oropharynx as per pre-operative assessment

## 2015-07-01 NOTE — Brief Op Note (Signed)
07/01/2015  11:18 AM  PATIENT:  Pamela Mueller  75 y.o. female  PRE-OPERATIVE DIAGNOSIS:  RLL LESION  POST-OPERATIVE DIAGNOSIS:  RLL LESION  PROCEDURE:  Procedure(s): VIDEO BRONCHOSCOPY (N/A) THORACOTOMY/RIGHT LOWER LOBECTOMY (Right)  SURGEON:  Surgeon(s) and Role:    * Gaye Pollack, MD - Primary  PHYSICIAN ASSISTANT: Erin Barrett, PA-C    ANESTHESIA:   general  EBL:  Total I/O In: 1100 [I.V.:1100] Out: 245 [Urine:165; Blood:80]  BLOOD ADMINISTERED:none  DRAINS: 2 49F  Chest Tube(s) in the right pleural space   LOCAL MEDICATIONS USED:  NONE  SPECIMEN:  Source of Specimen:  RLL  DISPOSITION OF SPECIMEN:  PATHOLOGY  COUNTS:  YES  TOURNIQUET:  * No tourniquets in log *  DICTATION: .Note written in EPIC  PLAN OF CARE: Admit to inpatient   PATIENT DISPOSITION:  PACU - hemodynamically stable.   Delay start of Pharmacological VTE agent (>24hrs) due to surgical blood loss or risk of bleeding: yes

## 2015-07-02 ENCOUNTER — Inpatient Hospital Stay (HOSPITAL_COMMUNITY): Payer: Medicare Other

## 2015-07-02 ENCOUNTER — Encounter (HOSPITAL_COMMUNITY): Payer: Self-pay | Admitting: Surgery

## 2015-07-02 LAB — BASIC METABOLIC PANEL
ANION GAP: 10 (ref 5–15)
BUN: 7 mg/dL (ref 6–20)
CHLORIDE: 106 mmol/L (ref 101–111)
CO2: 25 mmol/L (ref 22–32)
Calcium: 8.6 mg/dL — ABNORMAL LOW (ref 8.9–10.3)
Creatinine, Ser: 0.57 mg/dL (ref 0.44–1.00)
GFR calc Af Amer: >60 mL/min (ref >60)
GLUCOSE: 138 mg/dL — AB (ref 65–99)
POTASSIUM: 4 mmol/L (ref 3.5–5.1)
Sodium: 141 mmol/L (ref 135–145)

## 2015-07-02 LAB — GLUCOSE, CAPILLARY
GLUCOSE-CAPILLARY: 139 mg/dL — AB (ref 65–99)
Glucose-Capillary: 127 mg/dL — ABNORMAL HIGH (ref 65–99)

## 2015-07-02 LAB — POCT I-STAT 3, ART BLOOD GAS (G3+)
Acid-Base Excess: 1 mmol/L (ref 0.0–2.0)
BICARBONATE: 26.7 meq/L — AB (ref 20.0–24.0)
O2 Saturation: 96 %
PO2 ART: 83 mmHg (ref 80.0–100.0)
TCO2: 28 mmol/L (ref 0–100)
pCO2 arterial: 46.6 mmHg — ABNORMAL HIGH (ref 35.0–45.0)
pH, Arterial: 7.365 (ref 7.350–7.450)

## 2015-07-02 LAB — CBC
HEMATOCRIT: 34.8 % — AB (ref 36.0–46.0)
HEMOGLOBIN: 11.8 g/dL — AB (ref 12.0–15.0)
MCH: 31.2 pg (ref 26.0–34.0)
MCHC: 33.9 g/dL (ref 30.0–36.0)
MCV: 92.1 fL (ref 78.0–100.0)
Platelets: 178 10*3/uL (ref 150–400)
RBC: 3.78 MIL/uL — AB (ref 3.87–5.11)
RDW: 13.3 % (ref 11.5–15.5)
WBC: 7.7 10*3/uL (ref 4.0–10.5)

## 2015-07-02 NOTE — Progress Notes (Signed)
[facility] BRIEF SICU PROGRESS NOTE  1 Day Post-Op  S/P Procedure(s) (LRB): VIDEO BRONCHOSCOPY (N/A) THORACOTOMY/RIGHT LOWER LOBECTOMY (Right)   Stable day Pain control reportedly improved  Plan: Continue current plan  Rexene Alberts, MD 07/02/2015 10:45 PM

## 2015-07-02 NOTE — Progress Notes (Signed)
1 Day Post-Op Procedure(s) (LRB): VIDEO BRONCHOSCOPY (N/A) THORACOTOMY/RIGHT LOWER LOBECTOMY (Right) Subjective: Feels ok. Still has a fair amount of pain. Did not sleep well because she couldn't get comfortable.  Objective: Vital signs in last 24 hours: Temp:  [97.8 F (36.6 C)-98.5 F (36.9 C)] 98.4 F (36.9 C) (05/10 0834) Pulse Rate:  [65-86] 70 (05/10 0700) Cardiac Rhythm:  [-] Normal sinus rhythm (05/10 0700) Resp:  [12-23] 22 (05/10 0800) BP: (71-124)/(39-78) 99/61 mmHg (05/10 0700) SpO2:  [94 %-100 %] 98 % (05/10 0732) Arterial Line BP: (74-142)/(43-102) 108/50 mmHg (05/10 0700) Weight:  [59.5 kg (131 lb 2.8 oz)] 59.5 kg (131 lb 2.8 oz) (05/10 0500)  Hemodynamic parameters for last 24 hours:    Intake/Output from previous day: 05/09 0701 - 05/10 0700 In: 2253.8 [P.O.:120; I.V.:1933.8; IV Piggyback:200] Out: 1460 [Urine:980; Blood:80; Chest Tube:400] Intake/Output this shift:    General appearance: alert and cooperative Neurologic: intact Heart: regular rate and rhythm, S1, S2 normal, no murmur, click, rub or gallop Lungs: clear to auscultation bilaterally chest tube output serous, no air leak  Lab Results: No results for input(s): WBC, HGB, HCT, PLT in the last 72 hours. BMET:  Recent Labs  07/02/15 0443  NA 141  K 4.0  CL 106  CO2 25  GLUCOSE 138*  BUN 7  CREATININE 0.57  CALCIUM 8.6*    PT/INR: No results for input(s): LABPROT, INR in the last 72 hours. 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 Initial encounter.  EXAM: CHEST 1 VIEW  COMPARISON: 07/01/2015 and earlier.  FINDINGS: Portable AP upright view at 0647 hours.  2 right chest tubes remain in place. Stable right IJ central line. No pneumothorax identified. Stable lung volumes. Stable cardiac size and mediastinal contours. No pulmonary edema or acute pulmonary opacity.  IMPRESSION: 1. Stable lines and tubes. No pneumothorax identified. 2. No new cardiopulmonary abnormality.   Electronically Signed  By: Genevie Ann M.D.  On: 07/02/2015 07:55  Assessment/Plan: S/P Procedure(s) (LRB): VIDEO BRONCHOSCOPY (N/A) THORACOTOMY/RIGHT LOWER LOBECTOMY (Right)  She is hemodynamically stable  Will remove posterior chest tube today and decrease suction to 10 cm.   IV to [facility]  DC arterial line  OOB to chair and mobilize.   LOS: 1 day    Gaye Pollack 07/02/2015

## 2015-07-03 ENCOUNTER — Inpatient Hospital Stay (HOSPITAL_COMMUNITY): Payer: Medicare Other

## 2015-07-03 LAB — COMPREHENSIVE METABOLIC PANEL
ALT: 15 U/L (ref 14–54)
AST: 16 U/L (ref 15–41)
Albumin: 3 g/dL — ABNORMAL LOW (ref 3.5–5.0)
Alkaline Phosphatase: 39 U/L (ref 38–126)
Anion gap: 7 (ref 5–15)
BUN: 5 mg/dL — ABNORMAL LOW (ref 6–20)
CO2: 30 mmol/L (ref 22–32)
Calcium: 8.7 mg/dL — ABNORMAL LOW (ref 8.9–10.3)
Chloride: 103 mmol/L (ref 101–111)
Creatinine, Ser: 0.69 mg/dL (ref 0.44–1.00)
GFR calc non Af Amer: >60 mL/min (ref >60)
Glucose, Bld: 121 mg/dL — ABNORMAL HIGH (ref 65–99)
POTASSIUM: 3.6 mmol/L (ref 3.5–5.1)
SODIUM: 140 mmol/L (ref 135–145)
Total Bilirubin: 0.7 mg/dL (ref 0.3–1.2)
Total Protein: 5.4 g/dL — ABNORMAL LOW (ref 6.5–8.1)

## 2015-07-03 LAB — CBC
HCT: 33.3 % — ABNORMAL LOW (ref 36.0–46.0)
Hemoglobin: 11 g/dL — ABNORMAL LOW (ref 12.0–15.0)
MCH: 30.9 pg (ref 26.0–34.0)
MCHC: 33 g/dL (ref 30.0–36.0)
MCV: 93.5 fL (ref 78.0–100.0)
PLATELETS: 162 10*3/uL (ref 150–400)
RBC: 3.56 MIL/uL — AB (ref 3.87–5.11)
RDW: 13.5 % (ref 11.5–15.5)
WBC: 7 10*3/uL (ref 4.0–10.5)

## 2015-07-03 MED ORDER — PNEUMOCOCCAL VAC POLYVALENT 25 MCG/0.5ML IJ INJ
0.5000 mL | INJECTION | INTRAMUSCULAR | Status: DC
  Filled 2015-07-03: qty 0.5

## 2015-07-03 MED ORDER — IPRATROPIUM BROMIDE 0.02 % IN SOLN
0.5000 mg | Freq: Four times a day (QID) | RESPIRATORY_TRACT | Status: DC | PRN
Start: 2015-07-03 — End: 2015-07-08

## 2015-07-03 NOTE — Progress Notes (Signed)
2 Days Post-Op Procedure(s) (LRB): VIDEO BRONCHOSCOPY (N/A) THORACOTOMY/RIGHT LOWER LOBECTOMY (Right) Subjective: No complaints. Pain under control  Objective: Vital signs in last 24 hours: Temp:  [98.2 F (36.8 C)-99.4 F (37.4 C)] 99.2 F (37.3 C) (05/11 0400) Pulse Rate:  [73-91] 77 (05/11 0700) Cardiac Rhythm:  [-] Normal sinus rhythm (05/11 0600) Resp:  [15-23] 18 (05/11 0700) BP: (84-111)/(47-71) 92/59 mmHg (05/11 0700) SpO2:  [93 %-99 %] 95 % (05/11 0700) Arterial Line BP: (116-135)/(46-57) 129/57 mmHg (05/10 1000)  Hemodynamic parameters for last 24 hours:    Intake/Output from previous day: 05/10 0701 - 05/11 0700 In: 1460 [P.O.:260; I.V.:1000; IV Piggyback:200] Out: 1615 [Urine:1485; Chest Tube:130] Intake/Output this shift:    General appearance: alert and cooperative Heart: regular rate and rhythm, S1, S2 normal, no murmur, click, rub or gallop Lungs: clear to auscultation bilaterally Wound: dressing dry no definite air leak but tidaling.   Lab Results:  Recent Labs  07/02/15 0443 07/03/15 0408  WBC 7.7 7.0  HGB 11.8* 11.0*  HCT 34.8* 33.3*  PLT 178 162   BMET:  Recent Labs  07/02/15 0443 07/03/15 0408  NA 141 140  K 4.0 3.6  CL 106 103  CO2 25 30  GLUCOSE 138* 121*  BUN 7 5*  CREATININE 0.57 0.69  CALCIUM 8.6* 8.7*    PT/INR: No results for input(s): LABPROT, INR in the last 72 hours. ABG    Component Value Date/Time   PHART 7.365 07/02/2015 0453   HCO3 26.7* 07/02/2015 0453   TCO2 28 07/02/2015 0453   O2SAT 96.0 07/02/2015 0453   CBG (last 3)   Recent Labs  07/01/15 2323 07/02/15 0331 07/02/15 0832  GLUCAP 139* 127* 139*   CXR: ok  Assessment/Plan: S/P Procedure(s) (LRB): VIDEO BRONCHOSCOPY (N/A) THORACOTOMY/RIGHT LOWER LOBECTOMY (Right)  POD 2  She is doing well. Will put chest tube to water seal today and remove in am if no air leak and no significant ptx.  Continue IS and ambulation  Pathology pending   LOS:  2 days    Gaye Pollack 07/03/2015

## 2015-07-03 NOTE — Progress Notes (Signed)
      [address]       [place] [address]             [phone]      POD # 1 Right lower lobectomy  BP 97/80 mmHg  Pulse 88  Temp(Src) 98.1 F (36.7 C) (Oral)  Resp 23  Ht '5\' 3"'$  (1.6 m)  Wt 131 lb 2.8 oz (59.5 kg)  BMI 23.24 kg/m2  SpO2 98%   Intake/Output Summary (Last 24 hours) at 07/03/15 1729 Last data filed at 07/03/15 1400  Gross per 24 hour  Intake    730 ml  Output   1325 ml  Net   -595 ml    Resting comfortably  Presten Joost C. Roxan Hockey, MD [facility] [phone]

## 2015-07-04 ENCOUNTER — Inpatient Hospital Stay (HOSPITAL_COMMUNITY): Payer: Medicare Other

## 2015-07-04 MED ORDER — PNEUMOCOCCAL VAC POLYVALENT 25 MCG/0.5ML IJ INJ
0.5000 mL | INJECTION | INTRAMUSCULAR | Status: AC | PRN
  Administered 2015-07-08: 0.5 mL via INTRAMUSCULAR
  Filled 2015-07-04: qty 0.5

## 2015-07-04 NOTE — Care Management Important Message (Signed)
Important Message  Patient Details  Name: Pamela Mueller MRN: [id number] Date of Birth: [birth year]   Medicare Important Message Given:  Yes    Yohan Samons Abena 07/04/2015, 11:02 AM

## 2015-07-04 NOTE — Progress Notes (Signed)
Wasted 3.52m from Fentanyl syringe. PCurrie ParisRN witnessed waste.  GSydnee Cabal TLovena LeRN

## 2015-07-04 NOTE — Progress Notes (Signed)
Patient ID: Pamela Mueller, female   DOB: [birth year], 75 y.o.   MRN: [id number] EVENING ROUNDS NOTE :     [address]       [place] [address]             [phone]                 3 Days Post-Op Procedure(s) (LRB): VIDEO BRONCHOSCOPY (N/A) THORACOTOMY/RIGHT LOWER LOBECTOMY (Right)  Total Length of Stay:  LOS: 3 days  BP 100/59 mmHg  Pulse 73  Temp(Src) 98.5 F (36.9 C) (Oral)  Resp 25  Ht '5\' 3"'$  (1.6 m)  Wt 131 lb 2.8 oz (59.5 kg)  BMI 23.24 kg/m2  SpO2 95%  .Intake/Output      05/11 0701 - 05/12 0700 05/12 0701 - 05/13 0700   P.O. 360 240   I.V. (mL/kg) 240 (4) 90 (1.5)   IV Piggyback 100    Total Intake(mL/kg) 700 (11.8) 330 (5.5)   Urine (mL/kg/hr) 1460 (1) 1300 (2)   Stool 0 (0)    Chest Tube 100 (0.1) 140 (0.2)   Total Output 1560 1440   Net -860 -1110        Urine Occurrence 2 x    Stool Occurrence 3 x      . dextrose 5% lactated ringers 10 mL/hr at 07/03/15 1900     Lab Results  Component Value Date   WBC 7.0 07/03/2015   HGB 11.0* 07/03/2015   HCT 33.3* 07/03/2015   PLT 162 07/03/2015   GLUCOSE 121* 07/03/2015   ALT 15 07/03/2015   AST 16 07/03/2015   NA 140 07/03/2015   K 3.6 07/03/2015   CL 103 07/03/2015   CREATININE 0.69 07/03/2015   BUN 5* 07/03/2015   CO2 30 07/03/2015   INR 0.99 06/26/2015   140 from chest tubes, back on suction  pT1b, pN0, nodes negative   [name] MD  Beeper [phone] Office [phone] 07/04/2015 5:41 PM

## 2015-07-04 NOTE — Progress Notes (Signed)
Patient ID: Pamela Mueller, female   DOB: [birth year], 75 y.o.   MRN: [id number] 3 Days Post-Op Procedure(s) (LRB): VIDEO BRONCHOSCOPY (N/A) THORACOTOMY/RIGHT LOWER LOBECTOMY (Right) Subjective: No complaints. Pain under control  Objective: Vital signs in last 24 hours: Temp:  [97.8 F (36.6 C)-98.8 F (37.1 C)] 98.8 F (37.1 C) (05/12 0400) Pulse Rate:  [72-100] 72 (05/12 0700) Cardiac Rhythm:  [-] Normal sinus rhythm (05/12 0800) Resp:  [11-24] 11 (05/12 0800) BP: (87-139)/(53-80) 131/77 mmHg (05/12 0700) SpO2:  [90 %-100 %] 97 % (05/12 0800)  Hemodynamic parameters for last 24 hours:    Intake/Output from previous day: 05/11 0701 - 05/12 0700 In: 700 [P.O.:360; I.V.:240; IV Piggyback:100] Out: 1560 [Urine:1460; Chest Tube:100] Intake/Output this shift: Total I/O In: 10 [I.V.:10] Out: -   General appearance: alert and cooperative Heart: regular rate and rhythm, S1, S2 normal, no murmur, click, rub or gallop Lungs: clear to auscultation bilaterally Wound: dressing dry Air leak with cough  Lab Results:  Recent Labs  07/02/15 0443 07/03/15 0408  WBC 7.7 7.0  HGB 11.8* 11.0*  HCT 34.8* 33.3*  PLT 178 162   BMET:   Recent Labs  07/02/15 0443 07/03/15 0408  NA 141 140  K 4.0 3.6  CL 106 103  CO2 25 30  GLUCOSE 138* 121*  BUN 7 5*  CREATININE 0.57 0.69  CALCIUM 8.6* 8.7*    PT/INR: No results for input(s): LABPROT, INR in the last 72 hours. ABG    Component Value Date/Time   PHART 7.365 07/02/2015 0453   HCO3 26.7* 07/02/2015 0453   TCO2 28 07/02/2015 0453   O2SAT 96.0 07/02/2015 0453   CBG (last 3)   Recent Labs  07/01/15 2323 07/02/15 0331 07/02/15 0832  GLUCAP 139* 127* 139*   CXR: ok  Assessment/Plan: S/P Procedure(s) (LRB): VIDEO BRONCHOSCOPY (N/A) THORACOTOMY/RIGHT LOWER LOBECTOMY (Right)  POD 3  She is doing well. Will put chest tube back to suction with air leak with cough and small ptx on the right    Continue IS and  ambulation  Pathology pending   LOS: 3 days    Pamela Mueller 07/04/2015

## 2015-07-05 ENCOUNTER — Inpatient Hospital Stay (HOSPITAL_COMMUNITY): Payer: Medicare Other

## 2015-07-05 LAB — BASIC METABOLIC PANEL
Anion gap: 9 (ref 5–15)
BUN: 11 mg/dL (ref 6–20)
CO2: 28 mmol/L (ref 22–32)
Calcium: 8.5 mg/dL — ABNORMAL LOW (ref 8.9–10.3)
Chloride: 103 mmol/L (ref 101–111)
Creatinine, Ser: 0.5 mg/dL (ref 0.44–1.00)
GFR calc Af Amer: >60 mL/min (ref >60)
GFR calc non Af Amer: >60 mL/min (ref >60)
Glucose, Bld: 118 mg/dL — ABNORMAL HIGH (ref 65–99)
Potassium: 3.6 mmol/L (ref 3.5–5.1)
Sodium: 140 mmol/L (ref 135–145)

## 2015-07-05 LAB — CBC
HCT: 32.1 % — ABNORMAL LOW (ref 36.0–46.0)
Hemoglobin: 10.8 g/dL — ABNORMAL LOW (ref 12.0–15.0)
MCH: 30.8 pg (ref 26.0–34.0)
MCHC: 33.6 g/dL (ref 30.0–36.0)
MCV: 91.5 fL (ref 78.0–100.0)
Platelets: 167 10*3/uL (ref 150–400)
RBC: 3.51 MIL/uL — ABNORMAL LOW (ref 3.87–5.11)
RDW: 13.1 % (ref 11.5–15.5)
WBC: 4.8 10*3/uL (ref 4.0–10.5)

## 2015-07-05 NOTE — Progress Notes (Signed)
Patient ID: Pamela Mueller, female   DOB: 12/14/1940, 75 y.o.   MRN: [id number] TCTS DAILY ICU PROGRESS NOTE                   [address]            [place] [address]          [phone]   4 Days Post-Op Procedure(s) (LRB): VIDEO BRONCHOSCOPY (N/A) THORACOTOMY/RIGHT LOWER LOBECTOMY (Right)  Total Length of Stay:  LOS: 4 days   Subjective: Feels better, walked 5 times yesterday   Objective: Vital signs in last 24 hours: Temp:  [98 F (36.7 C)-98.7 F (37.1 C)] 98 F (36.7 C) (05/12 2335) Pulse Rate:  [63-117] 67 (05/13 0700) Cardiac Rhythm:  [-] Normal sinus rhythm (05/13 0742) Resp:  [15-28] 19 (05/13 0700) BP: (82-147)/(46-88) 111/79 mmHg (05/13 0700) SpO2:  [88 %-100 %] 100 % (05/13 0700) Weight:  [129 lb 13.6 oz (58.9 kg)] 129 lb 13.6 oz (58.9 kg) (05/13 0700)  Filed Weights   07/01/15 0548 07/02/15 0500 07/05/15 0700  Weight: 126 lb (57.153 kg) 131 lb 2.8 oz (59.5 kg) 129 lb 13.6 oz (58.9 kg)    Weight change:    Hemodynamic parameters for last 24 hours:    Intake/Output from previous day: 05/12 0701 - 05/13 0700 In: 1130 [P.O.:960; I.V.:170] Out: 2615 [Urine:2175; Chest Tube:440]  Intake/Output this shift: Total I/O In: 50 [IV Piggyback:50] Out: -   Current Meds: Scheduled Meds: . acetaminophen  1,000 mg Oral Q6H   Or  . acetaminophen (TYLENOL) oral liquid 160 mg/5 mL  1,000 mg Oral Q6H  . antiseptic oral rinse  7 mL Mouth Rinse BID  . atorvastatin  40 mg Oral q1800  . bisacodyl  10 mg Oral Daily  . senna-docusate  1 tablet Oral QHS   Continuous Infusions: . dextrose 5% lactated ringers 10 mL/hr at 07/05/15 0200   PRN Meds:.ipratropium, levalbuterol, ondansetron (ZOFRAN) IV, oxyCODONE, pneumococcal 23 valent vaccine, potassium chloride, traMADol  General appearance: alert, cooperative and no distress Neurologic: intact Heart: regular rate and rhythm, S1, S2 normal, no murmur, click, rub or gallop Lungs: diminished breath  sounds bibasilar Abdomen: soft, non-tender; bowel sounds normal; no masses,  no organomegaly Extremities: extremities normal, atraumatic, no cyanosis or edema Wound: decreased air leak but still present with cought  Lab Results: CBC: Recent Labs  07/03/15 0408 07/05/15 0419  WBC 7.0 4.8  HGB 11.0* 10.8*  HCT 33.3* 32.1*  PLT 162 167   BMET:  Recent Labs  07/03/15 0408 07/05/15 0419  NA 140 140  K 3.6 3.6  CL 103 103  CO2 30 28  GLUCOSE 121* 118*  BUN 5* 11  CREATININE 0.69 0.50  CALCIUM 8.7* 8.5*    PT/INR: No results for input(s): LABPROT, INR in the last 72 hours. Radiology: Dg Chest Port 1 View  07/05/2015  CLINICAL DATA:  Chest tube in place.  Evaluate for pneumothorax. EXAM: PORTABLE CHEST 1 VIEW COMPARISON:  Jul 04, 2015 FINDINGS: The right chest tube remains with the distal tip in the right apex. The previously seen right apical pneumothorax has resolved and is not seen on this study. No change in the cardiomediastinal silhouette. No left-sided pneumothorax. No pulmonary nodules, masses, or new infiltrates. IMPRESSION: The right chest tube remains in place.  No pneumothorax seen today. Electronically Signed   By: Dorise Bullion III M.D   On: 07/05/2015 07:14     Assessment/Plan: S/P Procedure(s) (LRB):  VIDEO BRONCHOSCOPY (N/A) THORACOTOMY/RIGHT LOWER LOBECTOMY (Right) Plan for transfer to step-down: see transfer orders Leave chest to suction , decrease to 10 , ptx resolved     [name] 07/05/2015 8:09 AM

## 2015-07-06 ENCOUNTER — Inpatient Hospital Stay (HOSPITAL_COMMUNITY): Payer: Medicare Other

## 2015-07-06 LAB — BASIC METABOLIC PANEL
Anion gap: 10 (ref 5–15)
BUN: 12 mg/dL (ref 6–20)
CO2: 27 mmol/L (ref 22–32)
Calcium: 8.8 mg/dL — ABNORMAL LOW (ref 8.9–10.3)
Chloride: 104 mmol/L (ref 101–111)
Creatinine, Ser: 0.62 mg/dL (ref 0.44–1.00)
GFR calc Af Amer: >60 mL/min (ref >60)
GFR calc non Af Amer: >60 mL/min (ref >60)
Glucose, Bld: 111 mg/dL — ABNORMAL HIGH (ref 65–99)
Potassium: 4.3 mmol/L (ref 3.5–5.1)
Sodium: 141 mmol/L (ref 135–145)

## 2015-07-06 LAB — CBC
HCT: 33.6 % — ABNORMAL LOW (ref 36.0–46.0)
Hemoglobin: 11 g/dL — ABNORMAL LOW (ref 12.0–15.0)
MCH: 29.8 pg (ref 26.0–34.0)
MCHC: 32.7 g/dL (ref 30.0–36.0)
MCV: 91.1 fL (ref 78.0–100.0)
Platelets: 167 10*3/uL (ref 150–400)
RBC: 3.69 MIL/uL — ABNORMAL LOW (ref 3.87–5.11)
RDW: 13.2 % (ref 11.5–15.5)
WBC: 3.6 10*3/uL — ABNORMAL LOW (ref 4.0–10.5)

## 2015-07-06 MED ORDER — ZOLPIDEM TARTRATE 5 MG PO TABS
5.0000 mg | ORAL_TABLET | Freq: Every evening | ORAL | Status: DC | PRN
  Administered 2015-07-07 – 2015-07-08 (×2): 5 mg via ORAL
  Filled 2015-07-06 (×2): qty 1

## 2015-07-06 NOTE — Progress Notes (Addendum)
      [address]       [place] [address]             [phone]      5 Days Post-Op Procedure(s) (LRB): VIDEO BRONCHOSCOPY (N/A) THORACOTOMY/RIGHT LOWER LOBECTOMY (Right)   Subjective:  Ms. Boran has no complaints.  States she is doing okay.  Per nursing patient was awake all night.  Objective: Vital signs in last 24 hours: Temp:  [97.3 F (36.3 C)-99.2 F (37.3 C)] 98.2 F (36.8 C) (05/14 0741) Pulse Rate:  [35-92] 78 (05/14 0741) Cardiac Rhythm:  [-] Normal sinus rhythm (05/14 0749) Resp:  [11-28] 18 (05/14 0741) BP: (105-143)/(54-98) 136/54 mmHg (05/14 0741) SpO2:  [70 %-100 %] 95 % (05/14 0741)  Intake/Output from previous day: 05/13 0701 - 05/14 0700 In: 1290 [P.O.:960; I.V.:180; IV Piggyback:150] Out: 1925 [Urine:1775; Chest Tube:150] Intake/Output this shift: Total I/O In: 240 [P.O.:240] Out: 0   General appearance: alert, cooperative and no distress Heart: regular rate and rhythm Lungs: clear to auscultation bilaterally Abdomen: soft, non-tender; bowel sounds normal; no masses,  no organomegaly Wound: clean and dry  Lab Results:  Recent Labs  07/05/15 0419 07/06/15 0500  WBC 4.8 3.6*  HGB 10.8* 11.0*  HCT 32.1* 33.6*  PLT 167 167   BMET:  Recent Labs  07/05/15 0419 07/06/15 0500  NA 140 141  K 3.6 4.3  CL 103 104  CO2 28 27  GLUCOSE 118* 111*  BUN 11 12  CREATININE 0.50 0.62  CALCIUM 8.5* 8.8*    PT/INR: No results for input(s): LABPROT, INR in the last 72 hours. ABG    Component Value Date/Time   PHART 7.365 07/02/2015 0453   HCO3 26.7* 07/02/2015 0453   TCO2 28 07/02/2015 0453   O2SAT 96.0 07/02/2015 0453   CBG (last 3)  No results for input(s): GLUCAP in the last 72 hours.  Assessment/Plan: S/P Procedure(s) (LRB): VIDEO BRONCHOSCOPY (N/A) THORACOTOMY/RIGHT LOWER LOBECTOMY (Right)  1. Chest tube- 2+ air leak with cough, CXR is free from pneumothorax- on 10cm suction 2. CV- mild HTN, will monitor if  continues will add anti hypertensive 3. Insomnia- Ambien prn 4. DIspo- patient stable, will discuss chest tube management with Dr. Servando Snare, repeat CXR in AM   LOS: 5 days    BARRETT, ERIN 07/06/2015  Chest xray ok Small air leak with cough then stops Chest tube to water seal D/c central line I have seen and examined [name] and agree with the above assessment  and plan.  [name] MD Beeper [phone] Office [phone] 07/06/2015 11:59 AM

## 2015-07-07 ENCOUNTER — Inpatient Hospital Stay (HOSPITAL_COMMUNITY): Payer: Medicare Other

## 2015-07-07 NOTE — Care Management Note (Signed)
Case Management Note  Patient Details  Name: Cady Hafen MRN: [id number] Date of Birth: November 30, 1940  Subjective/Objective:   Patient lives alone, pta indep, s/p thoracotomy, chest tube x 1 d'cd, up in chair, plan for dc tomorrow.  NCM will cont to follow for dc needs.                 Action/Plan:   Expected Discharge Date:                  Expected Discharge Plan:  Home/Self Care  In-House Referral:     Discharge planning Services  CM Consult  Post Acute Care Choice:    Choice offered to:     DME Arranged:    DME Agency:     HH Arranged:    [place] Agency:     Status of Service:  Completed, signed off  Medicare Important Message Given:  Yes Date Medicare IM Given:    Medicare IM give by:    Date Additional Medicare IM Given:    Additional Medicare Important Message give by:     If discussed at [place] of Stay Meetings, dates discussed:    Additional Comments:  Zenon Mayo, RN 07/07/2015, 3:46 PM

## 2015-07-07 NOTE — Progress Notes (Signed)
6 Days Post-Op Procedure(s) (LRB): VIDEO BRONCHOSCOPY (N/A) THORACOTOMY/RIGHT LOWER LOBECTOMY (Right) Subjective: No complaints, feels well  Objective: Vital signs in last 24 hours: Temp:  [97.5 F (36.4 C)-98.5 F (36.9 C)] 98.4 F (36.9 C) (05/15 0310) Pulse Rate:  [77-91] 77 (05/15 0309) Cardiac Rhythm:  [-] Normal sinus rhythm (05/15 0309) Resp:  [22-30] 23 (05/15 0309) BP: (101-132)/(59-75) 106/59 mmHg (05/15 0309) SpO2:  [94 %-99 %] 96 % (05/15 0309)  Hemodynamic parameters for last 24 hours:    Intake/Output from previous day: 05/14 0701 - 05/15 0700 In: 1200 [P.O.:1200] Out: 1260 [Urine:1200; Chest Tube:60] Intake/Output this shift:    General appearance: alert and cooperative Heart: regular rate and rhythm, S1, S2 normal, no murmur, click, rub or gallop Lungs: clear to auscultation bilaterally Wound: incision ok tidaling but no air leak from tube   Lab Results:  Recent Labs  07/05/15 0419 07/06/15 0500  WBC 4.8 3.6*  HGB 10.8* 11.0*  HCT 32.1* 33.6*  PLT 167 167   BMET:  Recent Labs  07/05/15 0419 07/06/15 0500  NA 140 141  K 3.6 4.3  CL 103 104  CO2 28 27  GLUCOSE 118* 111*  BUN 11 12  CREATININE 0.50 0.62  CALCIUM 8.5* 8.8*    PT/INR: No results for input(s): LABPROT, INR in the last 72 hours. ABG    Component Value Date/Time   PHART 7.365 07/02/2015 0453   HCO3 26.7* 07/02/2015 0453   TCO2 28 07/02/2015 0453   O2SAT 96.0 07/02/2015 0453   CBG (last 3)  No results for input(s): GLUCAP in the last 72 hours.  Assessment/Plan: S/P Procedure(s) (LRB): VIDEO BRONCHOSCOPY (N/A) THORACOTOMY/RIGHT LOWER LOBECTOMY (Right)  She is doing well CXR shows trivial apical space. There is tidaling as expected but no air leak with cough. Will remove tube and check CXR. Repeat CXR in am and if unchanged she can go home. I would not be surprised by a small apical space for a while.   LOS: 6 days    Gaye Pollack 07/07/2015

## 2015-07-07 NOTE — Discharge Summary (Signed)
Physician Discharge Summary  Patient ID: Pamela Mueller MRN: [id number] DOB/AGE: [birth year] 75 y.o.  Admit date: 07/01/2015 Discharge date: 07/08/2015  Admission Diagnoses:Right lower lobe lung mass  Discharge Diagnoses:  Active Problems:   S/P thoracotomy  Patient Active Problem List   Diagnosis Date Noted  . S/P thoracotomy 07/01/2015  Hyperlipidemia Hypertension Abdominal hysterectomy Tonsillectomy Tobacco abuse  HPI:  The patient is a 75 year old active heavy smoker who presented with some left upper chest pain radiating through to the back associated with cough productive of phlegm. She had a CXR on 04/13/2015 showing focal opacification over the right base. A CT scan the same day showed a 1.8 x 2.8 spiculated mass in the RLL just above the diaphragm. There was also an area of nodular consolidation abutting the pleura over the anterior medial lingula with adjacent reticulonodular opacification. There was a patchy area of reticulonodular opacification over the anterior left upper lobe. There was a 1 cm precarinal node and a few other small mediastinal nodes but no hilar adenopathy. A PET scan on 04/21/2015 showed the RLL mass to be hypermetabolic with an SUV of 27.03. The 3.2 x3.5 cm lingular left upper lobe part solid mass had an SUV of 5.95 which is in the malignant range. There was no hypermetabolic mediastinal or hilar adenopathy and no hypermetabolic activity anywhere else. Her left chest pain resolved quickly and has not recurred. She underwent bronchoscopy by Dr. Verdie Mosher which was unremarkable. The biopsy of the RLL showed acute and chronic inflammation with no malignancy.  She had limited spirometry performed in Dr. Marlana Salvage office which showed an FEV1 of 1.0 ( 56% predicted). She is 124 lbs with a BSA of 1.58 m2.  She had a stress echo treadmill test on 04/14/2015 which was a submaximal test with a blunted heart rate response but was negative for ischemia with a normal  EF.  When I saw her initially on 05/05/2015 I ordered a repeat CT scan to evaluate the left upper lobe partially solid mass that was hypermetabolic on PET scan. The lesion was smaller consistent with a resolving infectious or inflammatory process. Biopsy was therefore deferred. The 2.7 cm spiculated mass in the RLL was unchanged and this is intensely hypermetabolic on PET scan. Her PFT's show a moderate obstructive defect with an FEV1 of 1.3 ( 64% predicted) and a moderately severe diffusion defect ( 52% predicted) There was no response to bronchodilators. She continued to smoke 1-1/2 packs per day. I felt that her PFT's were probably adequate to tolerate a lower lobectomy if she quit smoking. She quit smoking after our visit on 05/05/2015 and has felt much better. She has continued to work at [employer] and has remained active.    Discharged Condition: good  Hospital Course: The patient was admitted electively and taken to the operating room at which time she underwent the below described procedure. She tolerated well was taken to the postanesthesia care unit in stable condition.   Pathology revealed invasive squamous cell carcinoma(see report below)  Clinically she has made very good postoperative progress. The chest tubes were managed in a routine manner and discontinued. She has an expected acute blood loss anemia and values are stable with most recent hemoglobin and hematocrit 11/33. Oxygen has been weaned and she maintains good saturations on room air. Pain has been under adequate control using standard measures initially with PCA with transition to oral medications. She is tolerating diet. She is tolerating gradually increasing activities using standard protocols. She has  maintained stable hemodynamics although has been hypertensive at times. Incisions are healing well without evidence of infection. Currently she is felt to be stable for discharge.   Consults: None  Diagnosis 1. Lung, resection  (segmental or lobe), right lower lobe - INVASIVE SQUAMOUS CELL CARCINOMA, MODERATELY DIFFERENTIATED, SPANNING 3.0 CM. - LYMPHOVASCULAR INVASION IS IDENTIFIED. - THERE IS NO EVIDENCE OF CARCINOMA IN 2 OF 2 LYMPH NODES (0/2). - SURGICAL RESECTION MARGINS ARE NEGATIVE FOR CARCINOMA. - SEE ONCOLOGY TABLE BELOW. 2. Lymph node, biopsy, 10R - THERE IS NO EVIDENCE OF CARCINOMA IN 1 OF 1 LYMPH NODE (0/1). Microscopic Comment 1. LUNG Specimen, including laterality: Right lower lobe. Procedure: Lobectomy Specimen integrity (intact/disrupted): Intact. Tumor site: Right lower lobe, subpleural Tumor focality: Unifocal Maximum tumor size (cm): 3.0 cm (gross measurement). Histologic type: Squamous cell carcinoma. Grade: Moderately differentiated. Margins: Negative for carcinoma. Distance to closest margin (cm): 1.0 cm to the bronchial resection margin. Visceral pleura invasion: Not identified. Tumor extension: Confined to lung parenchyma. Treatment effect (if treated with neoadjuvant therapy): N/A Lymph -Vascular invasion: Present, focal Lymph nodes: Number examined - 3; Number N1 nodes positive: 0; Number N2 nodes positive: 0 TNM code: pT1b, pN0 Ancillary studies: To help phenotype the tumor, immunohistochemical stains were performed, revealing that the 1 of 3 FINAL for [place] [phone]) Microscopic Comment(continued) malignant cells are positive for cytokeratin 5/6, cytokeratin 903, p63, and focal staining for TTF-1. They are negative for Napsin-A. A mucicarmine stain fails to highlight the presence of any mucin. Additional molecular studies can be performed upon clinician request. Non-neoplastic lung: Mild interstitial fibrosis and intraalveolar macrophages. (JBK:gt, 07/04/15) Enid Cutter MD Pathologist, Electronic Signature   Significant Diagnostic Studies: routine post-op lab/serial CXR's  Treatments: Surgery CARDIOTHORACIC SURGERY OPERATIVE NOTE 07/01/2015 [name] [id number]  Surgeon: Gaye Pollack, MD  First Assistant: Ellwood Handler, PA-C    Preoperative Diagnosis: Right lower lobe lung mass  Postoperative Diagnosis: Same   Procedure:  1. Flexible fiberoptic bronchoscopy 2. Right muscle-sparing thoracotomy 3. Right lower lobectomy   Anesthesia: General Endotracheal  Discharge Exam: Blood pressure 148/72, pulse 82, temperature 97.8 F (36.6 C), temperature source Oral, resp. rate 19, height '5\' 3"'$  (1.6 m), weight 129 lb 13.6 oz (58.9 kg), SpO2 96 %.   General appearance: alert, cooperative and no distress Heart: regular rate and rhythm Lungs: clear to auscultation bilaterally Abdomen: benign Extremities: no edema Wound: incis healing well   Disposition: discharged home    Medication List    TAKE these medications        aspirin 325 MG tablet  Take 325 mg by mouth daily.     atorvastatin 40 MG tablet  Commonly known as:  LIPITOR  Take 40 mg by mouth daily.     oxyCODONE 5 MG immediate release tablet  Commonly known as:  Oxy IR/ROXICODONE  Take 1-2 tablets (5-10 mg total) by mouth every 6 (six) hours as needed for severe pain.       Follow-up Information    Follow up with Gaye Pollack, MD.   Specialty:  Cardiothoracic Surgery   Why:  2 weeks, with chest xray from [facility] 1/2 hour prior to appointment. [facility] is located in the same office complex.The office will contact you also to arrange suture removal next week   Contact information:   [address] [place] [address] [phone]       Signed: John Giovanni 07/08/2015, 7:56 AM

## 2015-07-07 NOTE — Discharge Instructions (Signed)
Thoracoscopy, Care After Refer to this sheet in the next few weeks. These instructions provide you with information about caring for yourself after your procedure. Your health care provider may also give you more specific instructions. Your treatment has been planned according to current medical practices, but problems sometimes occur. Call your health care provider if you have any problems or questions after your procedure. WHAT TO EXPECT AFTER THE PROCEDURE: After your procedure, it is common to feel sore for up to two weeks. HOME CARE INSTRUCTIONS  There are many different ways to close and cover an incision, including stitches (sutures), skin glue, and adhesive strips. Follow your health care provider's instructions about:  Incision care.  Bandage (dressing) changes and removal.  Incision closure removal.  Check your incision area every day for signs of infection. Watch for:  Redness, swelling, or pain.  Fluid, blood, or pus.  Take medicines only as directed by your health care provider.  Try to cough often. Coughing helps to protect against lung infection (pneumonia). It may hurt to cough. If this happens, hold a pillow against your chest when you cough.  Take deep breaths. This also helps to protect against pneumonia.  If you were given an incentive spirometer, use it as directed by your health care provider.  Do not take baths, swim, or use a hot tub until your health care provider approves. You may take showers.  Avoid lifting until your health care provider approves.  Avoid driving until your health care provider approves.  Do not travel by airplane after the chest tube is removed until your health care provider approves. SEEK MEDICAL CARE IF:  You have a fever.  Pain medicines do not ease your pain.  You have redness, swelling, or increasing pain in your incision area.  You develop a cough that does not go away, or you are coughing up mucus that is yellow or  green. SEEK IMMEDIATE MEDICAL CARE IF:  You have fluid, blood, or pus coming from your incision.  There is a bad smell coming from your incision or dressing.  You develop a rash.  You have difficulty breathing.  You cough up blood.  You develop light-headedness or you feel faint.  You develop chest pain.  Your heartbeat feels irregular or very fast.   This information is not intended to replace advice given to you by your health care provider. Make sure you discuss any questions you have with your health care provider.   Document Released: 08/28/2004 Document Revised: 03/01/2014 Document Reviewed: 10/24/2013 Elsevier Interactive Patient Education [employer].

## 2015-07-07 NOTE — Progress Notes (Signed)
Patient ID: Pamela Mueller, female   DOB: 27-Aug-1940, 75 y.o.   MRN: [id number] CT Surgery:  She had a good day today and ambulated without difficulty. Remaining chest tube is out and that helped her pain. Follow up CXR looks good without significant ptx. Will repeat CXR in am and let her go home if no changes.

## 2015-07-07 NOTE — Care Management Important Message (Signed)
Important Message  Patient Details  Name: Karlie Aung MRN: [id number] Date of Birth: [birth year]   Medicare Important Message Given:  Yes    Zenon Mayo, RN 07/07/2015, 3:45 [place] Message  Patient Details  Name: Nika Yazzie MRN: [id number] Date of Birth: 10-12-1940   Medicare Important Message Given:  Yes    Zenon Mayo, RN 07/07/2015, 3:45 PM

## 2015-07-08 ENCOUNTER — Inpatient Hospital Stay (HOSPITAL_COMMUNITY): Payer: Medicare Other

## 2015-07-08 DIAGNOSIS — C3431 Malignant neoplasm of lower lobe, right bronchus or lung: Secondary | ICD-10-CM | POA: Diagnosis not present

## 2015-07-08 MED ORDER — OXYCODONE HCL 5 MG PO TABS: 5.0000 mg | ORAL_TABLET | Freq: Four times a day (QID) | ORAL | Status: DC | PRN

## 2015-07-08 NOTE — Progress Notes (Signed)
Pt ready for discharge, pt states her daughter will be here to take her home around 1530. I will go over discharge instructions including medications, follow up appts and wound care. Consuelo Pandy RN

## 2015-07-08 NOTE — Care Management Note (Signed)
Case Management Note  Patient Details  Name: Pamela Mueller MRN: [id number] Date of Birth: 1940-05-27  Subjective/Objective:  Patient for dc today, no needs.                  Action/Plan:   Expected Discharge Date:                  Expected Discharge Plan:  Home/Self Care  In-House Referral:     Discharge planning Services  CM Consult  Post Acute Care Choice:    Choice offered to:     DME Arranged:    DME Agency:     HH Arranged:    [place] Agency:     Status of Service:  Completed, signed off  Medicare Important Message Given:  Yes Date Medicare IM Given:    Medicare IM give by:    Date Additional Medicare IM Given:    Additional Medicare Important Message give by:     If discussed at [place] of Stay Meetings, dates discussed:    Additional Comments:  Zenon Mayo, RN 07/08/2015, 11:59 AM

## 2015-07-08 NOTE — Progress Notes (Addendum)
[address]       [place] [address]             [phone]      7 Days Post-Op Procedure(s) (LRB): VIDEO BRONCHOSCOPY (N/A) THORACOTOMY/RIGHT LOWER LOBECTOMY (Right) Subjective: Looks and feels well   Objective: Vital signs in last 24 hours: Temp:  [97.8 F (36.6 C)-99.2 F (37.3 C)] 97.8 F (36.6 C) (05/16 0336) Pulse Rate:  [72-82] 82 (05/16 0336) Cardiac Rhythm:  [-] Normal sinus rhythm (05/16 0336) Resp:  [19-25] 19 (05/16 0336) BP: (116-148)/(59-86) 148/72 mmHg (05/16 0336) SpO2:  [93 %-99 %] 96 % (05/16 0336)  Hemodynamic parameters for last 24 hours:    Intake/Output from previous day: 05/15 0701 - 05/16 0700 In: 840 [P.O.:840] Out: 2590 [Urine:2550; Chest Tube:40] Intake/Output this shift:    General appearance: alert, cooperative and no distress Heart: regular rate and rhythm Lungs: clear to auscultation bilaterally Abdomen: benign Extremities: no edema Wound: incis healing well  Lab Results:  Recent Labs  07/06/15 0500  WBC 3.6*  HGB 11.0*  HCT 33.6*  PLT 167   BMET:  Recent Labs  07/06/15 0500  NA 141  K 4.3  CL 104  CO2 27  GLUCOSE 111*  BUN 12  CREATININE 0.62  CALCIUM 8.8*    PT/INR: No results for input(s): LABPROT, INR in the last 72 hours. ABG    Component Value Date/Time   PHART 7.365 07/02/2015 0453   HCO3 26.7* 07/02/2015 0453   TCO2 28 07/02/2015 0453   O2SAT 96.0 07/02/2015 0453   CBG (last 3)  No results for input(s): GLUCAP in the last 72 hours.  Meds Scheduled Meds: . atorvastatin  40 mg Oral q1800  . bisacodyl  10 mg Oral Daily  . senna-docusate  1 tablet Oral QHS   Continuous Infusions:  PRN Meds:.ipratropium, levalbuterol, ondansetron (ZOFRAN) IV, oxyCODONE, pneumococcal 23 valent vaccine, traMADol, zolpidem  Xrays Dg Chest 2 View  07/07/2015  CLINICAL DATA:  Chest tube removal, prior thoracotomy. EXAM: CHEST  2 VIEW COMPARISON:  07/07/2015 at 6:14 a.m. FINDINGS: The right  chest tube is been removed. Questionable less than 5% right apical pneumothorax, similar to this morning's exam. Minimal linear subsegmental atelectasis at the right lung base. Stable volume loss in the right hemithorax. Atherosclerotic aortic arch without cardiomegaly. IMPRESSION: 1. Similar appearance to this morning's exam with a less than 5% right apical pneumothorax. The right chest tube is been removed. No enlargement of the pneumothorax identified. 2. Volume loss in the right hemithorax. 3. Atherosclerotic aortic arch. Electronically Signed   By: Van Clines M.D.   On: 07/07/2015 12:48   Dg Chest Port 1 View  07/07/2015  CLINICAL DATA:  Pneumothorax with right chest tube in position EXAM: PORTABLE CHEST 1 VIEW COMPARISON:  Jul 06, 2015 FINDINGS: Right-sided chest tube is unchanged in position. Right jugular catheter is no longer apparent. There is a minimal right apical pneumothorax without appreciable tension component. There is atelectatic change in the right base which is stable. The lungs elsewhere clear. Heart size and pulmonary vascularity are normal. No adenopathy. No bone lesions. IMPRESSION: Rather minimal right apical pneumothorax without tension component. Right base atelectatic change. Stable appearing right chest tube. Right jugular catheter removed. Left lung clear. No change in cardiac silhouette. Electronically Signed   By: Lowella Grip III M.D.   On: 07/07/2015 07:24    Assessment/Plan: S/P Procedure(s) (LRB): VIDEO BRONCHOSCOPY (N/A) THORACOTOMY/RIGHT LOWER LOBECTOMY (Right) Plan  for discharge: see discharge orders Will get sutures out in 1 week Discussed instructions with the patient  LOS: 7 days    GOLD,WAYNE E 07/08/2015   Chart reviewed, patient examined, agree with above. CXR unchanged with trivial right apical space. She feels well. Plan home today.

## 2015-07-15 ENCOUNTER — Encounter (INDEPENDENT_AMBULATORY_CARE_PROVIDER_SITE_OTHER): Payer: Self-pay

## 2015-07-15 DIAGNOSIS — Z9889 Other specified postprocedural states: Secondary | ICD-10-CM

## 2015-07-22 ENCOUNTER — Other Ambulatory Visit: Payer: Self-pay | Admitting: Surgery

## 2015-07-22 DIAGNOSIS — Z9889 Other specified postprocedural states: Secondary | ICD-10-CM

## 2015-07-23 ENCOUNTER — Encounter: Payer: Self-pay | Admitting: Surgery

## 2015-07-23 ENCOUNTER — Ambulatory Visit (INDEPENDENT_AMBULATORY_CARE_PROVIDER_SITE_OTHER): Payer: Self-pay | Admitting: Surgery

## 2015-07-23 ENCOUNTER — Ambulatory Visit
Admission: RE | Admit: 2015-07-23 | Discharge: 2015-07-23 | Disposition: A | Discharge status: Home / Self Care | Payer: Medicare Other | Source: Ambulatory Visit | Attending: Surgery | Admitting: Surgery

## 2015-07-23 VITALS — BP 129/74 | HR 73 | Resp 16 | Ht 63.5 in | Wt 127.8 lb

## 2015-07-23 DIAGNOSIS — Z9889 Other specified postprocedural states: Secondary | ICD-10-CM

## 2015-07-23 DIAGNOSIS — Z902 Acquired absence of lung [part of]: Secondary | ICD-10-CM

## 2015-07-23 DIAGNOSIS — C3431 Malignant neoplasm of lower lobe, right bronchus or lung: Secondary | ICD-10-CM

## 2015-07-23 DIAGNOSIS — Z736 Limitation of activities due to disability: Secondary | ICD-10-CM

## 2015-07-23 NOTE — Progress Notes (Signed)
     HPI: Patient returns for routine postoperative follow-up having undergone right muscle-sparing thoracotomy and right lower lobectomy on 07/01/2015. The patient's early postoperative recovery while in the hospital was notable for an uncomplicated postop course. The final pathology showed a 3.0 cm T1b, N0 moderately differentiated squamous cell carcinoma with lymphovascular invasion but negative surgical margins. Since hospital discharge the patient reports that she has been feeling well. She is not using any pain medication. She is walking without shortness of breath. She continues to abstain from smoking. Her two daughters are here with her today.   Current Outpatient Prescriptions  Medication Sig Dispense Refill  . aspirin 325 MG tablet Take 325 mg by mouth daily.    Marland Kitchen atorvastatin (LIPITOR) 40 MG tablet Take 40 mg by mouth daily.    Marland Kitchen oxyCODONE (OXY IR/ROXICODONE) 5 MG immediate release tablet Take 1-2 tablets (5-10 mg total) by mouth every 6 (six) hours as needed for severe pain. (Patient not taking: Reported on 07/23/2015) 30 tablet 0   No current facility-administered medications for this visit.    Physical Exam: BP 129/74 mmHg  Pulse 73  Resp 16  Ht 5' 3.5" (1.613 m)  Wt 127 lb 12.8 oz (57.97 kg)  BMI 22.28 kg/m2  SpO2 98% She looks well Lungs are clear The right chest incisions are healing well  Diagnostic Tests:  CLINICAL DATA: History of right lower lobectomy on 07/01/2015 for resection of invasive squamous cell carcinoma, followup  EXAM: CHEST 2 VIEW  COMPARISON: Chest x-ray of 07/08/2015 and CT chest of 05/09/2015, PET-CT of 04/21/2015  FINDINGS: Opacity remains at the right lung base consistent with volume loss and small right pleural effusion with atelectasis. The left lung is clear. Mediastinal and hilar contours are unchanged. The heart is within normal limits in size. No acute bony abnormality is seen  IMPRESSION: Stable postoperative change of  the right lung base after right lower lobectomy with volume loss and small right effusion.   Electronically Signed  By: Ivar Drape M.D.  On: 07/23/2015 15:25   Impression:  She has a stage 1A squamous cell lung cancer that has been completely resected. There is no indication for any adjuvant therapy. She is making a good recovery and would like to return to her job at [employer] at 8 weeks postop which I think is fine. Told her that she could drive but should not do any lifting of more than 10 lbs for 8 weeks postop.   Plan:  I will see her in 3 months with a CXR for continued surveillance.   Gaye Pollack, MD [facility] [phone]

## 2015-09-30 ENCOUNTER — Other Ambulatory Visit: Payer: Self-pay | Admitting: Surgery

## 2015-09-30 DIAGNOSIS — Z9889 Other specified postprocedural states: Secondary | ICD-10-CM

## 2015-10-01 ENCOUNTER — Ambulatory Visit
Admission: RE | Admit: 2015-10-01 | Discharge: 2015-10-01 | Disposition: A | Discharge status: Home / Self Care | Payer: Medicare Other | Source: Ambulatory Visit | Attending: Surgery | Admitting: Surgery

## 2015-10-01 ENCOUNTER — Ambulatory Visit (INDEPENDENT_AMBULATORY_CARE_PROVIDER_SITE_OTHER): Payer: Self-pay | Admitting: Surgery

## 2015-10-01 ENCOUNTER — Encounter: Payer: Self-pay | Admitting: Surgery

## 2015-10-01 VITALS — BP 128/73 | HR 67 | Resp 18 | Ht 63.5 in | Wt 130.0 lb

## 2015-10-01 DIAGNOSIS — Z9889 Other specified postprocedural states: Secondary | ICD-10-CM

## 2015-10-01 DIAGNOSIS — Z902 Acquired absence of lung [part of]: Secondary | ICD-10-CM

## 2015-10-01 DIAGNOSIS — C3431 Malignant neoplasm of lower lobe, right bronchus or lung: Secondary | ICD-10-CM

## 2015-10-02 ENCOUNTER — Encounter: Payer: Self-pay | Admitting: Surgery

## 2015-10-02 NOTE — Progress Notes (Signed)
     HPI:  Patient returns for routine surveillance follow-up having undergone right muscle-sparing thoracotomy and right lower lobectomy on 07/01/2015. The final pathology showed a 3.0 cm T1b, N0 moderately differentiated squamous cell carcinoma with lymphovascular invasion but negative surgical margins. She continues to abstain from smoking. She is eating well but says she has gained weight since not smoking. She denies any cough or sputum production, no hemoptysis, no headache or visual changes, no muscle or bone pain.  Current Outpatient Prescriptions  Medication Sig Dispense Refill  . aspirin 325 MG tablet Take 325 mg by mouth daily.    Marland Kitchen atorvastatin (LIPITOR) 40 MG tablet Take 40 mg by mouth daily.     No current facility-administered medications for this visit.      Physical Exam: BP 128/73 (BP Location: Right Arm, Patient Position: Sitting, Cuff Size: Small)   Pulse 67   Resp 18   Ht 5' 3.5" (1.613 m)   Wt 130 lb (59 kg)   SpO2 95%   BMI 22.67 kg/m  She looks well There is no cervical or supraclavicular adenopathy Lungs are clear The right thoracotomy incision looks good Cardiac exam shows a regular rate and rhythm with normal heart sounds. Abdominal exam is benign  Diagnostic Tests:  CLINICAL DATA:  Right lower lobectomy on 07/01/2015, followup, former smoking history  EXAM: CHEST  2 VIEW  COMPARISON:  Chest x-ray of 07/23/2015  FINDINGS: The volume loss on the right after right lower lobectomy is stable. Also there still appears to be a small right pleural effusion present. The left lung is clear. Mediastinal and hilar contours are unchanged, with the descending thoracic aorta being a ectatic. The heart is within normal limits in size. No bony abnormality is seen.  IMPRESSION: No change in volume loss on the right after right lower lobectomy. Probable small right pleural effusion remains.   Electronically Signed   By: Ivar Drape M.D.   On:  10/01/2015 14:41   Impression:  She is doing well three months following lung cancer resection with no sign of recurrent cancer. Her CXR looks fine. I will plan to see her back in three months with a CT scan of the chest.  Plan:  Return in 3 months with CT scan of the chest.   Gaye Pollack, MD [facility] [phone]

## 2015-12-15 ENCOUNTER — Other Ambulatory Visit: Payer: Self-pay | Admitting: *Deleted

## 2015-12-15 DIAGNOSIS — R911 Solitary pulmonary nodule: Secondary | ICD-10-CM

## 2016-01-21 ENCOUNTER — Ambulatory Visit: Payer: Medicare Other | Admitting: Surgery

## 2016-01-21 ENCOUNTER — Inpatient Hospital Stay: Admission: RE | Admit: 2016-01-21 | Payer: Medicare Other | Source: Ambulatory Visit

## 2016-01-28 ENCOUNTER — Encounter: Payer: Self-pay | Admitting: Surgery

## 2016-01-28 ENCOUNTER — Ambulatory Visit (INDEPENDENT_AMBULATORY_CARE_PROVIDER_SITE_OTHER): Payer: Medicare Other | Admitting: Surgery

## 2016-01-28 ENCOUNTER — Ambulatory Visit
Admission: RE | Admit: 2016-01-28 | Discharge: 2016-01-28 | Disposition: A | Discharge status: Home / Self Care | Payer: Medicare Other | Source: Ambulatory Visit | Attending: Surgery | Admitting: Surgery

## 2016-01-28 ENCOUNTER — Other Ambulatory Visit: Payer: Medicare Other

## 2016-01-28 VITALS — BP 137/83 | HR 63 | Resp 16 | Ht 63.5 in | Wt 136.3 lb

## 2016-01-28 DIAGNOSIS — R911 Solitary pulmonary nodule: Secondary | ICD-10-CM

## 2016-01-28 DIAGNOSIS — R918 Other nonspecific abnormal finding of lung field: Secondary | ICD-10-CM

## 2016-01-28 DIAGNOSIS — C3431 Malignant neoplasm of lower lobe, right bronchus or lung: Secondary | ICD-10-CM | POA: Diagnosis not present

## 2016-01-28 DIAGNOSIS — Z902 Acquired absence of lung [part of]: Secondary | ICD-10-CM

## 2016-02-01 ENCOUNTER — Encounter: Payer: Self-pay | Admitting: Surgery

## 2016-02-01 NOTE — Progress Notes (Signed)
     HPI:  Patient returns for routine surveillance follow-up having undergone right muscle-sparing thoracotomy and right lower lobectomy on 07/01/2015. The final pathology showed a 3.0 cm T1b, N0 moderately differentiated squamous cell carcinoma with lymphovascular invasion but negative surgical margins. She continues to abstain from smoking. She is eating well but says she has gained weight since not smoking. She denies any cough or sputum production, no hemoptysis, no headache or visual changes, no muscle or bone pain.  Current Outpatient Prescriptions  Medication Sig Dispense Refill  . aspirin 325 MG tablet Take 325 mg by mouth daily.    Marland Kitchen atorvastatin (LIPITOR) 40 MG tablet Take 40 mg by mouth daily.     No current facility-administered medications for this visit.      Physical Exam:  BP 137/83 (BP Location: Right Arm, Patient Position: Sitting, Cuff Size: Normal)   Pulse 63   Resp 16   Ht 5' 3.5" (1.613 m)   Wt 136 lb 4.8 oz (61.8 kg)   SpO2 98% Comment: ON RA  BMI 23.77 kg/m  She looks well There is no cervical or supraclavicular adenopathy Lungs are clear The right thoracotomy incision looks good Cardiac exam shows a regular rate and rhythm with normal heart sounds. Abdominal exam is benign  Diagnostic Tests:  CLINICAL DATA:  Right lung resection 07/01/2015. Followup lung cancer.  EXAM: CT CHEST WITHOUT CONTRAST  TECHNIQUE: Multidetector CT imaging of the chest was performed following the standard protocol without IV contrast.  COMPARISON:  05/09/2015.  FINDINGS: Cardiovascular: The heart size appears normal. No pericardial effusion. Aortic atherosclerosis is identified. Calcification in the LAD, left circumflex and RCA coronary artery noted.  Mediastinum/Nodes: The trachea appears patent and is midline. Normal appearance of the esophagus. No mediastinal or hilar adenopathy identified.  Lungs/Pleura: There is a small right pleural effusion. Status  post right lower lobectomy. Consent stable right upper lobe lung nodule measuring 6 mm, image 46 of series 4. There are 3 subpleural nodules identified in the right base, image number 68 of series 4. Nonspecific measuring up to 5 mm. Left lung appears clear.  Upper Abdomen: Liver cysts are again identified. Right renal calculi noted. No hydronephrosis.  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 No aggressive lytic or sclerotic bone lesions identified.  IMPRESSION: 1. Status post right lower lobectomy. 2. Several small nonspecific pulmonary nodules are identified at the right base. Attention on follow-up imaging is advised. 3. 6 mm right upper lobe lung nodule is unchanged from previous exam. 4. Small right pleural effusion 5. Aortic atherosclerosis and multi vessel coronary artery calcification.   Electronically Signed   By: Kerby Moors M.D.   On: 01/28/2016 13:23   Impression:  She is doing well seven months following lung cancer resection with no sign of recurrent cancer. Her CT shows a stable 6 mm RUL lung nodule and several small nonspecific nodules in the right lung base that will be followed on her next CT in 6 months.  Plan:  I will see her back in 6 months with a CT scan of the chest   Gaye Pollack, MD [facility] [phone]

## 2016-07-21 ENCOUNTER — Ambulatory Visit: Payer: Medicare Other | Admitting: Surgery

## 2016-08-17 ENCOUNTER — Ambulatory Visit: Payer: Medicare Other | Admitting: Surgery

## 2016-09-15 IMAGING — MR MR HEAD WO/W CM
10 of 13 series · 35 of 48 positions shown · IV contrast (multihance)
Comparison: None.

CLINICAL DATA: New diagnosis lung cancer.  Staging.

EXAM:
MRI HEAD WITHOUT AND WITH CONTRAST
TECHNIQUE: Multiplanar, multiecho pulse sequences of the brain and surrounding
structures were obtained without and with intravenous contrast.
CONTRAST:  11 cc MultiHance

[Series 3: T1 · sagittal · 5.0mm · 0.47mm/px · 1 of 24 slices shown]
[im 1/24]
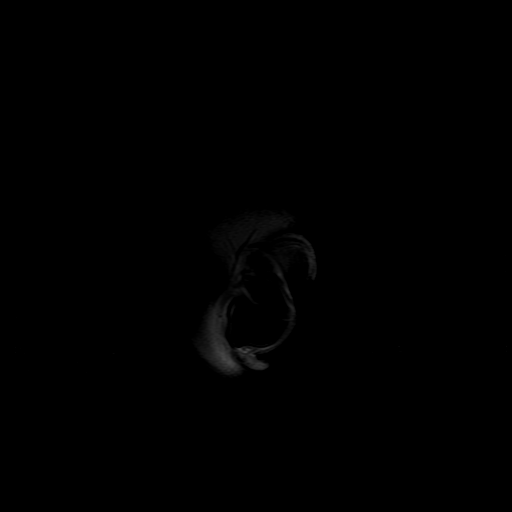

[Series 4: DWI · axial · 3.0mm · 1.09mm/px · z∈[-2,+134]mm · 8 of 94 slices shown (1 of 4)]
[im 1/94]
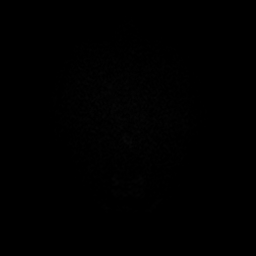
[im 14/94]
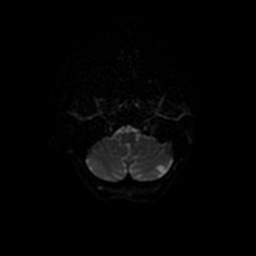
[im 27/94]
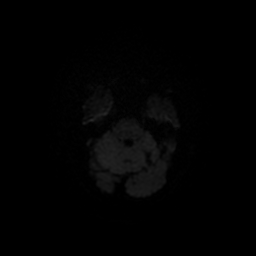
[im 40/94]
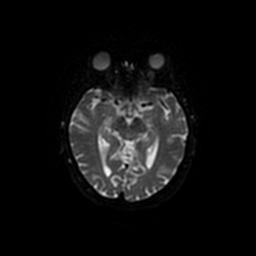
[im 54/94]
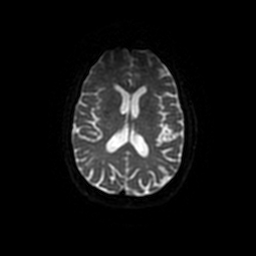
[im 67/94]
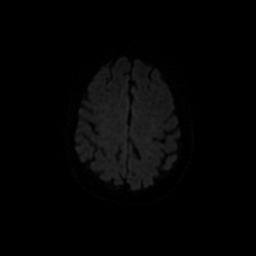
[im 80/94]
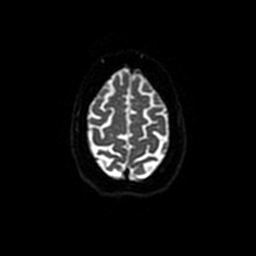
[im 94/94]
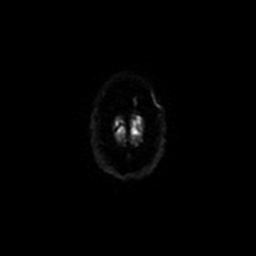

[Series 5: DWI · coronal · 5.0mm · 1.09mm/px · 7 of 74 slices shown (2 of 4)]
[im 1/74]
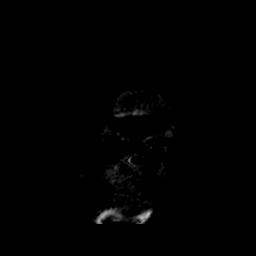
[im 13/74]
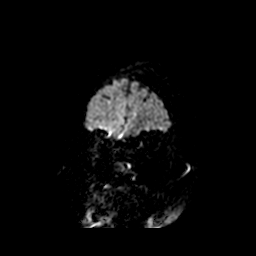
[im 25/74]
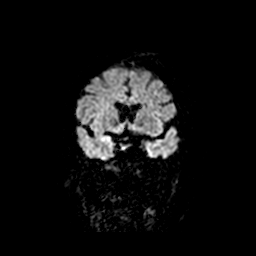
[im 37/74]
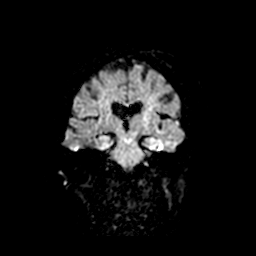
[im 49/74]
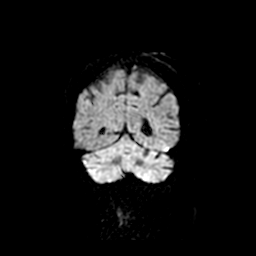
[im 61/74]
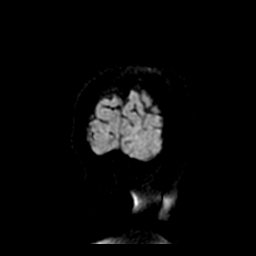
[im 74/74]
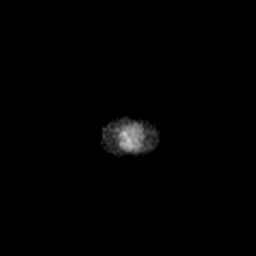

[Series 6: T2 · axial · 5.0mm · 0.43mm/px · z∈[-3,+145]mm · 2 of 24 slices shown]
[im 1/24]
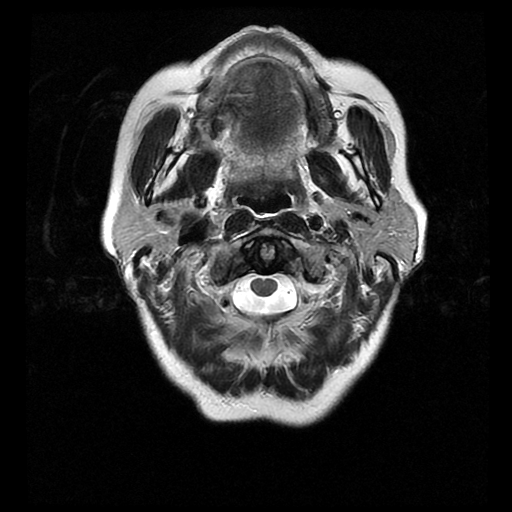
[im 24/24]
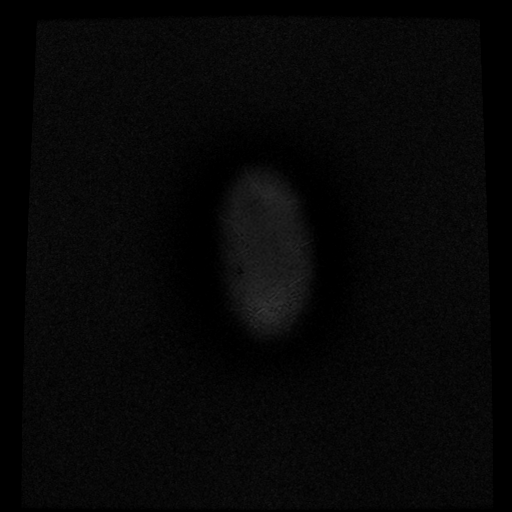

[Series 7: FLAIR · axial · 5.0mm · 0.43mm/px · z∈[-8,+151]mm · 2 of 24 slices shown]
[im 1/24]
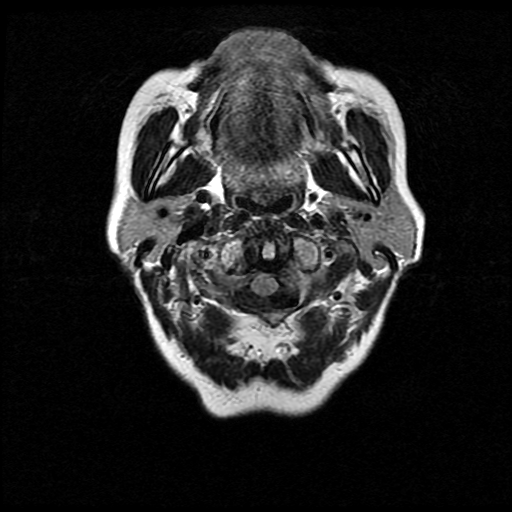
[im 24/24]
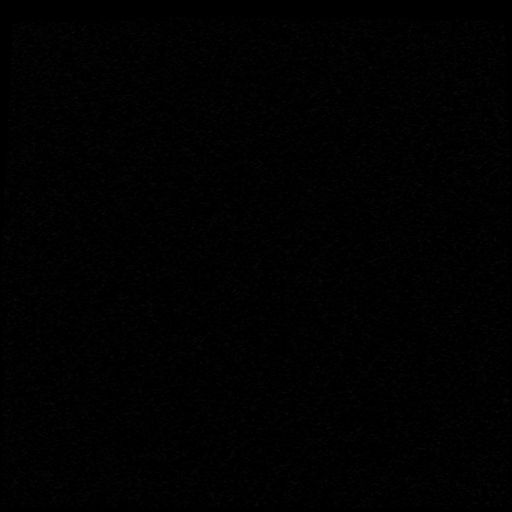

[Series 10: T2 post-contrast · coronal · 5.0mm · 0.45mm/px · 3 of 28 slices shown]
[im 1/28]
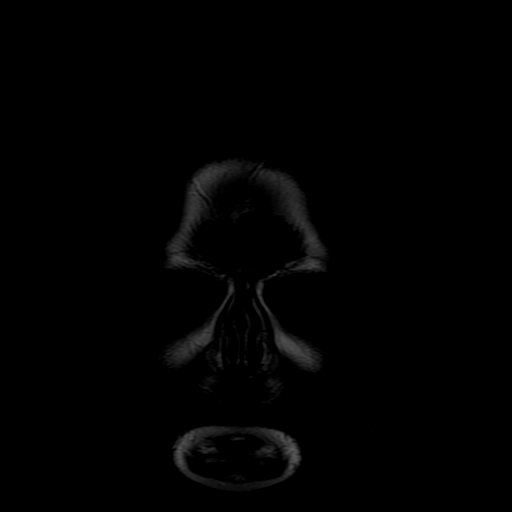
[im 14/28]
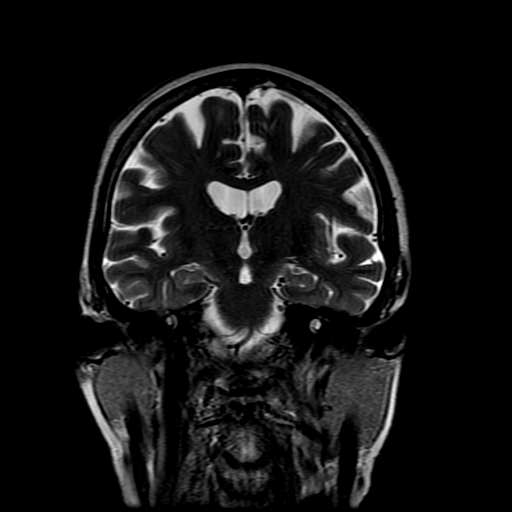
[im 28/28]
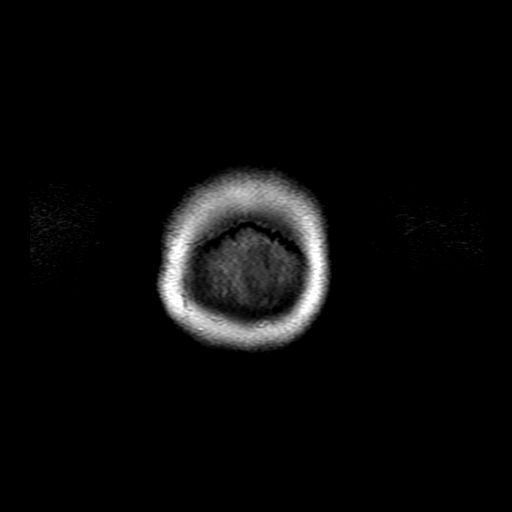

[Series 12: T1 post-contrast · coronal · 5.0mm · 0.45mm/px · 3 of 28 slices shown (1 of 2)]
[im 1/28]
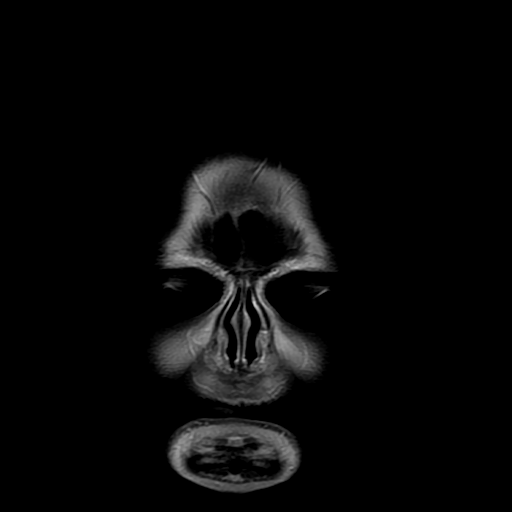
[im 14/28]
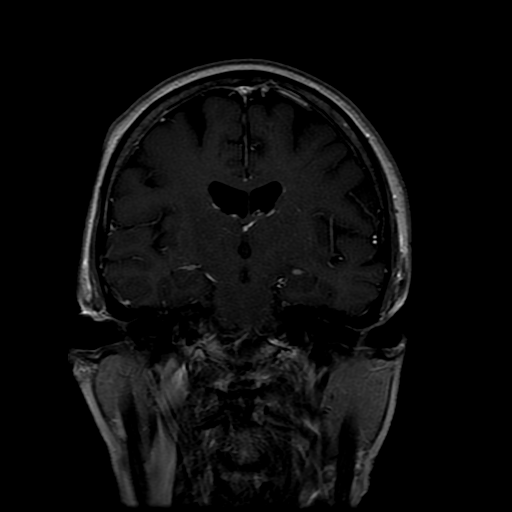
[im 28/28]
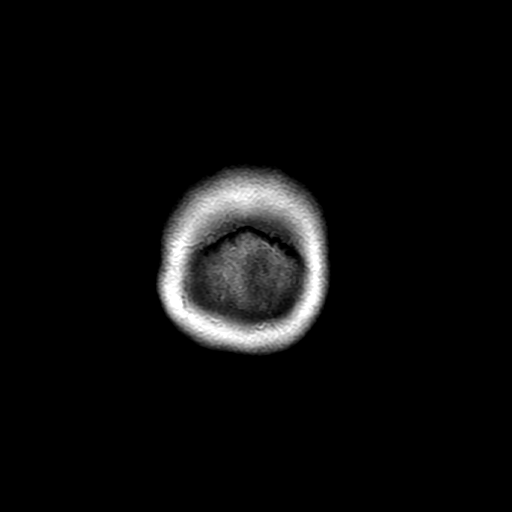

[Series 13: T1 post-contrast · sagittal · 5.0mm · 0.47mm/px · 2 of 24 slices shown (2 of 2)]
[im 1/24]
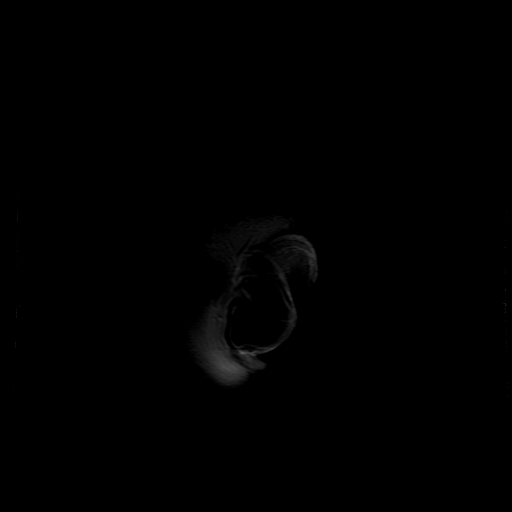
[im 24/24]
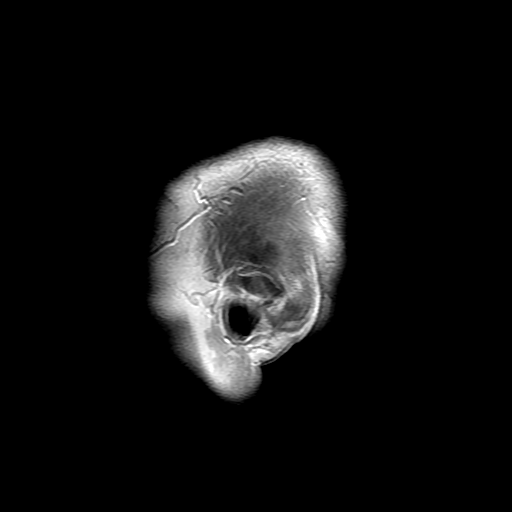

[Series 400: DWI · axial · 3.0mm · 1.09mm/px · z∈[-2,+134]mm · 4 of 47 slices shown (3 of 4)]
[im 1/47]
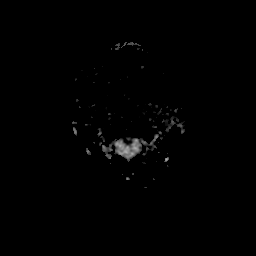
[im 16/47]
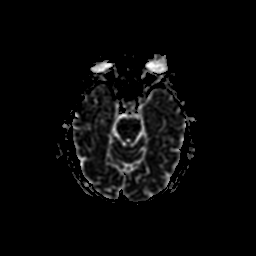
[im 31/47]
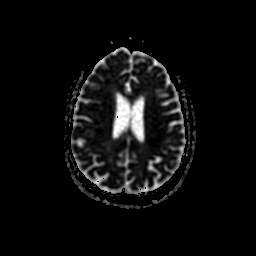
[im 47/47]
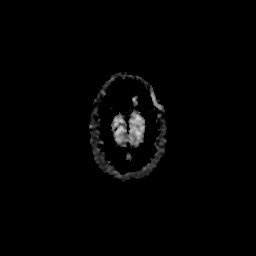

[Series 500: DWI · coronal · 5.0mm · 1.09mm/px · 3 of 38 slices shown (4 of 4)]
[im 1/38]
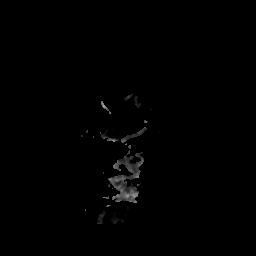
[im 19/38]
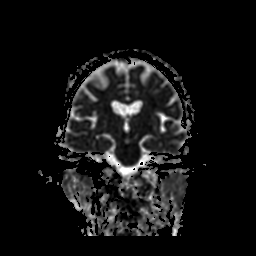
[im 38/38]
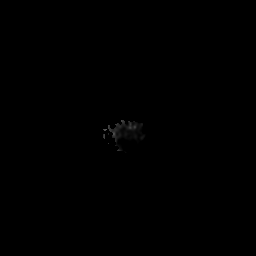

[35 of 48 positions shown; findings below may reference images not displayed]

FINDINGS: Diffusion imaging does not show any acute or subacute infarction.
There are old left cerebellar infarctions. There are minimal chronic
small-vessel ischemic changes of the hemispheric white matter. No
evidence of hemorrhage, hydrocephalus or extra-axial collection.
After contrast administration, there is no abnormal enhancement. No
evidence of metastatic disease. No skull or skullbase lesion is
seen. T1 hyperintense focus at the right parietal vertex is benign.
IMPRESSION: No evidence of metastatic disease.

Old left cerebellar infarctions.

## 2017-02-22 DEATH — deceased
